# Patient Record
Sex: Male | Born: 1950 | Race: White | Hispanic: No | Marital: Married | Smoking: Current every day smoker
Health system: Southern US, Community
[De-identification: ages and names within clinical notes are randomized; demographics above are authoritative.]

## PROBLEM LIST (undated history)

## (undated) DIAGNOSIS — M1990 Unspecified osteoarthritis, unspecified site: Secondary | ICD-10-CM

## (undated) DIAGNOSIS — I10 Essential (primary) hypertension: Secondary | ICD-10-CM

## (undated) DIAGNOSIS — IMO0001 Reserved for inherently not codable concepts without codable children: Secondary | ICD-10-CM

## (undated) DIAGNOSIS — I499 Cardiac arrhythmia, unspecified: Secondary | ICD-10-CM

## (undated) DIAGNOSIS — Z7901 Long term (current) use of anticoagulants: Secondary | ICD-10-CM

## (undated) DIAGNOSIS — I4891 Unspecified atrial fibrillation: Secondary | ICD-10-CM

## (undated) DIAGNOSIS — E785 Hyperlipidemia, unspecified: Secondary | ICD-10-CM

## (undated) DIAGNOSIS — E669 Obesity, unspecified: Secondary | ICD-10-CM

## (undated) DIAGNOSIS — Z720 Tobacco use: Secondary | ICD-10-CM

## (undated) DIAGNOSIS — G4730 Sleep apnea, unspecified: Secondary | ICD-10-CM

## (undated) DIAGNOSIS — J449 Chronic obstructive pulmonary disease, unspecified: Secondary | ICD-10-CM

## (undated) HISTORY — DX: Hyperlipidemia, unspecified: E78.5

## (undated) HISTORY — DX: Obesity, unspecified: E66.9

## (undated) HISTORY — PX: JOINT REPLACEMENT: SHX530

## (undated) HISTORY — DX: Long term (current) use of anticoagulants: Z79.01

## (undated) HISTORY — DX: Sleep apnea, unspecified: G47.30

## (undated) HISTORY — DX: Essential (primary) hypertension: I10

## (undated) HISTORY — PX: SHOULDER SURGERY: SHX246

## (undated) HISTORY — DX: Unspecified atrial fibrillation: I48.91

## (undated) HISTORY — PX: TOTAL KNEE ARTHROPLASTY: SHX125

## (undated) HISTORY — DX: Tobacco use: Z72.0

## (undated) HISTORY — DX: Unspecified osteoarthritis, unspecified site: M19.90

---

## 2007-10-27 ENCOUNTER — Ambulatory Visit: Payer: Self-pay | Admitting: Cardiology

## 2007-11-02 ENCOUNTER — Ambulatory Visit: Payer: Self-pay | Admitting: Cardiology

## 2007-11-02 ENCOUNTER — Ambulatory Visit (HOSPITAL_COMMUNITY): Admission: RE | Admit: 2007-11-02 | Discharge: 2007-11-02 | Payer: Self-pay | Admitting: Cardiology

## 2007-11-02 ENCOUNTER — Encounter: Payer: Self-pay | Admitting: Cardiology

## 2007-11-10 ENCOUNTER — Ambulatory Visit: Payer: Self-pay | Admitting: Cardiology

## 2007-11-20 ENCOUNTER — Ambulatory Visit: Payer: Self-pay | Admitting: Cardiology

## 2007-11-27 ENCOUNTER — Ambulatory Visit: Payer: Self-pay | Admitting: Cardiology

## 2007-12-04 ENCOUNTER — Ambulatory Visit: Payer: Self-pay | Admitting: Cardiology

## 2007-12-11 ENCOUNTER — Ambulatory Visit: Payer: Self-pay | Admitting: Cardiology

## 2007-12-14 ENCOUNTER — Inpatient Hospital Stay (HOSPITAL_COMMUNITY): Admission: RE | Admit: 2007-12-14 | Discharge: 2007-12-17 | Payer: Self-pay | Admitting: Orthopedic Surgery

## 2008-01-10 ENCOUNTER — Encounter (HOSPITAL_COMMUNITY): Admission: RE | Admit: 2008-01-10 | Discharge: 2008-01-17 | Payer: Self-pay | Admitting: Orthopedic Surgery

## 2008-01-11 ENCOUNTER — Ambulatory Visit: Payer: Self-pay | Admitting: Cardiology

## 2008-01-18 ENCOUNTER — Ambulatory Visit: Payer: Self-pay | Admitting: Cardiology

## 2008-01-19 ENCOUNTER — Encounter (HOSPITAL_COMMUNITY): Admission: RE | Admit: 2008-01-19 | Discharge: 2008-02-18 | Payer: Self-pay | Admitting: Orthopedic Surgery

## 2008-01-24 ENCOUNTER — Ambulatory Visit: Payer: Self-pay | Admitting: Cardiology

## 2008-01-29 ENCOUNTER — Ambulatory Visit: Payer: Self-pay | Admitting: Cardiology

## 2008-02-16 ENCOUNTER — Ambulatory Visit: Payer: Self-pay | Admitting: Cardiology

## 2008-02-19 ENCOUNTER — Encounter (HOSPITAL_COMMUNITY): Admission: RE | Admit: 2008-02-19 | Discharge: 2008-03-01 | Payer: Self-pay | Admitting: Orthopedic Surgery

## 2008-03-04 ENCOUNTER — Ambulatory Visit: Payer: Self-pay | Admitting: Cardiology

## 2008-04-01 ENCOUNTER — Ambulatory Visit: Payer: Self-pay | Admitting: Cardiology

## 2008-04-08 ENCOUNTER — Ambulatory Visit: Payer: Self-pay | Admitting: Cardiology

## 2008-04-29 ENCOUNTER — Ambulatory Visit: Payer: Self-pay | Admitting: Cardiology

## 2008-05-27 ENCOUNTER — Ambulatory Visit: Payer: Self-pay | Admitting: Cardiology

## 2008-06-27 ENCOUNTER — Ambulatory Visit: Payer: Self-pay | Admitting: Cardiology

## 2008-07-04 ENCOUNTER — Ambulatory Visit: Payer: Self-pay | Admitting: Cardiology

## 2008-07-15 ENCOUNTER — Ambulatory Visit: Payer: Self-pay | Admitting: Cardiology

## 2008-08-12 ENCOUNTER — Ambulatory Visit: Payer: Self-pay | Admitting: Cardiology

## 2008-09-12 ENCOUNTER — Ambulatory Visit: Payer: Self-pay | Admitting: Cardiology

## 2008-09-19 ENCOUNTER — Inpatient Hospital Stay (HOSPITAL_COMMUNITY): Admission: RE | Admit: 2008-09-19 | Discharge: 2008-09-22 | Payer: Self-pay | Admitting: Orthopedic Surgery

## 2008-10-22 ENCOUNTER — Encounter (HOSPITAL_COMMUNITY): Admission: RE | Admit: 2008-10-22 | Discharge: 2008-11-21 | Payer: Self-pay | Admitting: Orthopedic Surgery

## 2008-10-24 ENCOUNTER — Ambulatory Visit: Payer: Self-pay | Admitting: Cardiology

## 2008-11-14 ENCOUNTER — Ambulatory Visit: Payer: Self-pay | Admitting: Cardiology

## 2008-11-25 ENCOUNTER — Encounter: Payer: Self-pay | Admitting: Cardiology

## 2008-11-25 ENCOUNTER — Encounter (HOSPITAL_COMMUNITY): Admission: RE | Admit: 2008-11-25 | Discharge: 2008-12-25 | Payer: Self-pay | Admitting: Orthopedic Surgery

## 2008-11-28 ENCOUNTER — Encounter: Payer: Self-pay | Admitting: Cardiology

## 2008-11-28 ENCOUNTER — Ambulatory Visit: Payer: Self-pay | Admitting: Cardiology

## 2008-12-02 ENCOUNTER — Encounter: Payer: Self-pay | Admitting: *Deleted

## 2008-12-26 ENCOUNTER — Encounter (HOSPITAL_COMMUNITY): Admission: RE | Admit: 2008-12-26 | Discharge: 2009-01-15 | Payer: Self-pay | Admitting: Orthopedic Surgery

## 2009-01-08 ENCOUNTER — Encounter (INDEPENDENT_AMBULATORY_CARE_PROVIDER_SITE_OTHER): Payer: Self-pay | Admitting: Cardiology

## 2009-01-20 ENCOUNTER — Ambulatory Visit: Payer: Self-pay | Admitting: Cardiology

## 2009-02-06 ENCOUNTER — Encounter (INDEPENDENT_AMBULATORY_CARE_PROVIDER_SITE_OTHER): Payer: Self-pay | Admitting: Cardiology

## 2009-02-17 ENCOUNTER — Ambulatory Visit: Payer: Self-pay | Admitting: Cardiology

## 2009-02-17 LAB — CONVERTED CEMR LAB: POC INR: 2.8

## 2009-03-18 ENCOUNTER — Ambulatory Visit: Payer: Self-pay | Admitting: Cardiology

## 2009-03-18 DIAGNOSIS — F17200 Nicotine dependence, unspecified, uncomplicated: Secondary | ICD-10-CM | POA: Insufficient documentation

## 2009-03-18 DIAGNOSIS — I4891 Unspecified atrial fibrillation: Secondary | ICD-10-CM | POA: Insufficient documentation

## 2009-03-18 LAB — CONVERTED CEMR LAB
Albumin: 4.1 g/dL (ref 3.5–5.2)
Basophils Relative: 0 % (ref 0–1)
CO2: 23 meq/L (ref 19–32)
Calcium: 9.3 mg/dL (ref 8.4–10.5)
Cholesterol: 147 mg/dL (ref 0–200)
Glucose, Bld: 112 mg/dL — ABNORMAL HIGH (ref 70–99)
Hemoglobin: 14.5 g/dL (ref 13.0–17.0)
MCHC: 32.8 g/dL (ref 30.0–36.0)
MCV: 85.5 fL (ref 78.0–100.0)
Monocytes Absolute: 0.8 10*3/uL (ref 0.1–1.0)
Monocytes Relative: 7 % (ref 3–12)
Neutro Abs: 9.3 10*3/uL — ABNORMAL HIGH (ref 1.7–7.7)
Potassium: 4.3 meq/L (ref 3.5–5.3)
RBC: 5.17 M/uL (ref 4.22–5.81)
Sodium: 144 meq/L (ref 135–145)
Total Bilirubin: 0.4 mg/dL (ref 0.3–1.2)
Total Protein: 6.6 g/dL (ref 6.0–8.3)
Triglycerides: 169 mg/dL — ABNORMAL HIGH (ref <150)

## 2009-03-19 ENCOUNTER — Encounter: Payer: Self-pay | Admitting: Cardiology

## 2009-03-31 ENCOUNTER — Telehealth (INDEPENDENT_AMBULATORY_CARE_PROVIDER_SITE_OTHER): Payer: Self-pay | Admitting: *Deleted

## 2009-04-17 ENCOUNTER — Encounter (INDEPENDENT_AMBULATORY_CARE_PROVIDER_SITE_OTHER): Payer: Self-pay | Admitting: *Deleted

## 2009-04-17 ENCOUNTER — Ambulatory Visit: Payer: Self-pay | Admitting: Cardiology

## 2009-04-17 LAB — CONVERTED CEMR LAB
OCCULT 2: NEGATIVE
OCCULT 3: NEGATIVE

## 2009-04-22 ENCOUNTER — Encounter (INDEPENDENT_AMBULATORY_CARE_PROVIDER_SITE_OTHER): Payer: Self-pay | Admitting: *Deleted

## 2009-05-12 ENCOUNTER — Encounter: Payer: Self-pay | Admitting: Cardiology

## 2009-05-12 LAB — CONVERTED CEMR LAB
Chloride: 103 meq/L (ref 96–112)
Potassium: 4.4 meq/L (ref 3.5–5.3)
Sodium: 140 meq/L (ref 135–145)

## 2009-05-13 ENCOUNTER — Encounter: Payer: Self-pay | Admitting: Cardiology

## 2009-05-15 ENCOUNTER — Ambulatory Visit: Payer: Self-pay | Admitting: Cardiovascular Disease

## 2009-06-02 ENCOUNTER — Telehealth (INDEPENDENT_AMBULATORY_CARE_PROVIDER_SITE_OTHER): Payer: Self-pay

## 2009-06-12 ENCOUNTER — Ambulatory Visit: Payer: Self-pay | Admitting: Cardiology

## 2009-06-12 LAB — CONVERTED CEMR LAB: POC INR: 1.5

## 2009-07-10 ENCOUNTER — Ambulatory Visit: Payer: Self-pay | Admitting: Cardiology

## 2009-07-10 LAB — CONVERTED CEMR LAB: POC INR: 2.4

## 2009-08-07 ENCOUNTER — Ambulatory Visit: Payer: Self-pay | Admitting: Cardiology

## 2009-09-04 ENCOUNTER — Encounter (INDEPENDENT_AMBULATORY_CARE_PROVIDER_SITE_OTHER): Payer: Self-pay | Admitting: *Deleted

## 2009-09-10 ENCOUNTER — Ambulatory Visit: Payer: Self-pay | Admitting: Cardiology

## 2009-09-10 LAB — CONVERTED CEMR LAB: POC INR: 2.5

## 2009-10-13 ENCOUNTER — Ambulatory Visit: Payer: Self-pay | Admitting: Cardiology

## 2009-10-13 LAB — CONVERTED CEMR LAB: POC INR: 1.9

## 2009-11-10 ENCOUNTER — Ambulatory Visit: Payer: Self-pay | Admitting: Cardiology

## 2009-12-08 ENCOUNTER — Ambulatory Visit: Payer: Self-pay | Admitting: Cardiology

## 2010-01-06 ENCOUNTER — Encounter (INDEPENDENT_AMBULATORY_CARE_PROVIDER_SITE_OTHER): Payer: Self-pay | Admitting: *Deleted

## 2010-01-14 ENCOUNTER — Ambulatory Visit: Payer: Self-pay | Admitting: Cardiology

## 2010-01-19 ENCOUNTER — Encounter: Payer: Self-pay | Admitting: Cardiology

## 2010-02-11 ENCOUNTER — Ambulatory Visit: Payer: Self-pay | Admitting: Cardiology

## 2010-03-11 ENCOUNTER — Ambulatory Visit: Payer: Self-pay | Admitting: Cardiology

## 2010-03-18 ENCOUNTER — Encounter (INDEPENDENT_AMBULATORY_CARE_PROVIDER_SITE_OTHER): Payer: Self-pay | Admitting: *Deleted

## 2010-04-08 ENCOUNTER — Encounter: Payer: Self-pay | Admitting: Cardiology

## 2010-04-08 ENCOUNTER — Ambulatory Visit: Payer: Self-pay | Admitting: Cardiology

## 2010-04-08 LAB — CONVERTED CEMR LAB: POC INR: 2.6

## 2010-05-07 ENCOUNTER — Ambulatory Visit
Admission: RE | Admit: 2010-05-07 | Discharge: 2010-05-07 | Payer: Self-pay | Source: Home / Self Care | Attending: Cardiology | Admitting: Cardiology

## 2010-05-07 ENCOUNTER — Ambulatory Visit: Admission: RE | Admit: 2010-05-07 | Discharge: 2010-05-07 | Payer: Self-pay | Source: Home / Self Care

## 2010-05-07 ENCOUNTER — Encounter: Payer: Self-pay | Admitting: Cardiology

## 2010-05-07 LAB — CONVERTED CEMR LAB
Albumin: 4.1 g/dL (ref 3.5–5.2)
CO2: 27 meq/L (ref 19–32)
Calcium: 9.7 mg/dL (ref 8.4–10.5)
Chloride: 106 meq/L (ref 96–112)
Eosinophils Relative: 2 % (ref 0–5)
Glucose, Bld: 114 mg/dL — ABNORMAL HIGH (ref 70–99)
HCT: 46.7 % (ref 39.0–52.0)
Hemoglobin: 15.6 g/dL (ref 13.0–17.0)
Lymphocytes Relative: 19 % (ref 12–46)
Lymphs Abs: 2.6 10*3/uL (ref 0.7–4.0)
Monocytes Absolute: 0.8 10*3/uL (ref 0.1–1.0)
Neutro Abs: 9.6 10*3/uL — ABNORMAL HIGH (ref 1.7–7.7)
POC INR: 2.3
Platelets: 252 10*3/uL (ref 150–400)
Sodium: 143 meq/L (ref 135–145)
Total Bilirubin: 0.5 mg/dL (ref 0.3–1.2)
Total Protein: 6.8 g/dL (ref 6.0–8.3)
WBC: 13.3 10*3/uL — ABNORMAL HIGH (ref 4.0–10.5)

## 2010-05-08 ENCOUNTER — Encounter: Payer: Self-pay | Admitting: Cardiology

## 2010-05-11 ENCOUNTER — Encounter (INDEPENDENT_AMBULATORY_CARE_PROVIDER_SITE_OTHER): Payer: Self-pay | Admitting: *Deleted

## 2010-05-21 NOTE — Letter (Signed)
Summary: Appointment - Missed  [facility]  [address], [place] [address]   Phone: [phone]  Fax: [phone]     Sep 04, 2009 MRN: [id number]   AZURE BARRALES [address] [place], [place]  [address]   Dear Mr. MCADORY,  Our records indicate you missed your appointment on      09/04/09                  [facility]            It is very important that we reach you to reschedule this appointment. We look forward to participating in your health care needs. Please contact us at the number listed above at your earliest convenience to reschedule this appointment.     Sincerely,    Glass blower/designer

## 2010-05-21 NOTE — Miscellaneous (Signed)
  Clinical Lists Changes  Medications: Removed medication of LISINOPRIL-HYDROCHLOROTHIAZIDE 20-12.5 MG TABS (LISINOPRIL-HYDROCHLOROTHIAZIDE) take 1 tablet by mouth once daily

## 2010-05-21 NOTE — Miscellaneous (Signed)
Summary: stool cards 04/17/2009  Clinical Lists Changes  Observations: Added new observation of HEMOCCULT 3: neg (04/17/2009 9:23) Added new observation of HEMOCCULT 2: neg (04/17/2009 9:23) Added new observation of HEMOCCULT 1: neg (04/17/2009 9:23)

## 2010-05-21 NOTE — Letter (Signed)
Summary: Appointment - Missed  [facility]     [place], [place]    Phone:   Fax:      January 06, 2010 MRN: [id number]   IZACC DEMEYER [address] [place], [place]  [address]   Dear Mr. CONLEY,  Our records indicate you missed your appointment on          01/05/10 [facility]          It is very important that we reach you to reschedule this appointment. We look forward to participating in your health care needs. Please contact us at the number listed above at your earliest convenience to reschedule this appointment.     Sincerely,    Glass blower/designer

## 2010-05-21 NOTE — Medication Information (Signed)
Summary: ccr-lr  Anticoagulant Therapy  Managed by: [name], RN PCP: Karleen Hampshire Supervising MD: Dietrich Pates MD, Molly Maduro Indication 1: Atrial Fibrillation Lab Used: [facility] [place] Site: [place] INR POC 1.9  Dietary changes: no    Health status changes: no    Bleeding/hemorrhagic complications: no    Recent/future hospitalizations: no    Any changes in medication regimen? no    Recent/future dental: no  Any missed doses?: no       Is patient compliant with meds? yes       Allergies: 1)  ! * Iv Dye  Anticoagulation Management History:      The patient is taking warfarin and comes in today for a routine follow up visit.  Negative risk factors for bleeding include an age less than 36 years old.  The bleeding index is 'low risk'.  Positive CHADS2 values include History of HTN.  Negative CHADS2 values include Age > 9 years old.  The start date was 10/27/2007.  Anticoagulation responsible provider: Dietrich Pates MD, Molly Maduro.  INR POC: 1.9.  Cuvette Lot#: [id number].  Exp: 02/12.    Anticoagulation Management Assessment/Plan:      The patient's current anticoagulation dose is Warfarin sodium 10 mg tabs: Take 1 tablet by mouth as directed.  The target INR is 2.0-3.0.  The next INR is due 12/08/2009.  Anticoagulation instructions were given to patient.  Results were reviewed/authorized by [name], RN.  He was notified by [name] RN.         Prior Anticoagulation Instructions: INR 1.9 Take coumadin 1 1/2 tablet tonight then resume 1 tablet once daily   Current Anticoagulation Instructions: INR 1.9 Increase coumadin to 10mg  once daily except 15mg  on Mondays

## 2010-05-21 NOTE — Medication Information (Signed)
Summary: ccr-lr  Anticoagulant Therapy  Managed by: [name], RN PCP: Karleen Hampshire Supervising MD: Dietrich Pates MD, Molly Maduro Indication 1: Atrial Fibrillation (ICD-427.31) Lab Used: [facility] [place] Site: [place] INR POC 1.5  Dietary changes: no    Health status changes: no    Bleeding/hemorrhagic complications: no    Recent/future hospitalizations: no    Any changes in medication regimen? no    Recent/future dental: no  Any missed doses?: yes     Details: missed 1 dose this week  Is patient compliant with meds? yes       Allergies: 1)  ! * Iv Dye  Anticoagulation Management History:      The patient is taking warfarin and comes in today for a routine follow up visit.  Negative risk factors for bleeding include an age less than 61 years old.  The bleeding index is 'low risk'.  Positive CHADS2 values include History of HTN.  Negative CHADS2 values include Age > 61 years old.  The start date was 10/27/2007.  Anticoagulation responsible provider: Dietrich Pates MD, Molly Maduro.  INR POC: 1.5.  Cuvette Lot#: [id number].  Exp: 02/12.    Anticoagulation Management Assessment/Plan:      The patient's current anticoagulation dose is Warfarin sodium 10 mg tabs: Take 1 tablet by mouth as directed.  The target INR is 2 - 3.  The next INR is due 07/10/2009.  Anticoagulation instructions were given to patient.  Results were reviewed/authorized by [name], RN.  He was notified by [name] RN.         Prior Anticoagulation Instructions: INR 2.2 Continue coumadin 10mg  once daily   Current Anticoagulation Instructions: INR 1.5 Take coumadin 1 1/2 tablets tonight and tomorrow night then resume 1 tablet once daily

## 2010-05-21 NOTE — Medication Information (Signed)
Summary: ccr-lr  Anticoagulant Therapy  Managed by: [name], RN PCP: Karleen Hampshire Supervising MD: Diona Browner MD, Remi Deter Indication 1: Atrial Fibrillation Lab Used: [facility] [place] Site: [place] INR POC 2.1  Dietary changes: no    Health status changes: no    Bleeding/hemorrhagic complications: no    Recent/future hospitalizations: no    Any changes in medication regimen? no    Recent/future dental: no  Any missed doses?: no       Is patient compliant with meds? yes       Allergies: 1)  ! * Iv Dye  Anticoagulation Management History:      The patient is taking warfarin and comes in today for a routine follow up visit.  Negative risk factors for bleeding include an age less than 56 years old.  The bleeding index is 'low risk'.  Positive CHADS2 values include History of HTN.  Negative CHADS2 values include Age > 71 years old.  The start date was 10/27/2007.  Anticoagulation responsible Mekaela Azizi: Diona Browner MD, Remi Deter.  INR POC: 2.1.  Cuvette Lot#: [id number].  Exp: 02/12.    Anticoagulation Management Assessment/Plan:      The patient's current anticoagulation dose is Warfarin sodium 10 mg tabs: Take 1 tablet by mouth as directed.  The target INR is 2.0-3.0.  The next INR is due 04/08/2010.  Anticoagulation instructions were given to patient.  Results were reviewed/authorized by [name], RN.  He was notified by [name] RN.         Prior Anticoagulation Instructions: INR 2.2 Continue coumadin 10mg  once daily except 15mg  on Mondays  Current Anticoagulation Instructions: INR 2.1 Continue coumadin 10mg  once daily except 15mg  on Mondays

## 2010-05-21 NOTE — Letter (Signed)
Summary: DILTIAZEM APPROVAL  DILTIAZEM APPROVAL   Imported By: Faythe Ghee 01/19/2010 09:58:30  _____________________________________________________________________  External Attachment:    Type:   Image     Comment:   External Document

## 2010-05-21 NOTE — Medication Information (Signed)
Summary: ccr-lr  Anticoagulant Therapy  Managed by: [name], RN PCP: Karleen Hampshire Supervising MD: Daleen Squibb MD, Maisie Fus Indication 1: Atrial Fibrillation Lab Used: [facility] [place] Site: [place] INR POC 1.9  Dietary changes: no    Health status changes: no    Bleeding/hemorrhagic complications: no    Recent/future hospitalizations: no    Any changes in medication regimen? no    Recent/future dental: no  Any missed doses?: yes     Details: missed 1 dose last Thursday  Is patient compliant with meds? yes       Allergies: 1)  ! * Iv Dye  Anticoagulation Management History:      The patient is taking warfarin and comes in today for a routine follow up visit.  Negative risk factors for bleeding include an age less than 4 years old.  The bleeding index is 'low risk'.  Positive CHADS2 values include History of HTN.  Negative CHADS2 values include Age > 5 years old.  The start date was 10/27/2007.  Anticoagulation responsible provider: Daleen Squibb MD, Maisie Fus.  INR POC: 1.9.  Cuvette Lot#: [id number].  Exp: 02/12.    Anticoagulation Management Assessment/Plan:      The patient's current anticoagulation dose is Warfarin sodium 10 mg tabs: Take 1 tablet by mouth as directed.  The target INR is 2.0-3.0.  The next INR is due 01/05/2010.  Anticoagulation instructions were given to patient.  Results were reviewed/authorized by [name], RN.  He was notified by [name] RN.         Prior Anticoagulation Instructions: INR 1.9 Increase coumadin to 10mg  once daily except 15mg  on Mondays  Current Anticoagulation Instructions: INR 1.9 Take coumadin 2 tablets tonight then resume 1 tablet once daily except 1 1/2 on Mondays

## 2010-05-21 NOTE — Medication Information (Signed)
Summary: ccr  Anticoagulant Therapy  Managed by: [name], RN PCP: Karleen Hampshire Supervising MD: Diona Browner MD, Remi Deter Indication 1: Atrial Fibrillation Lab Used: [facility] [place] Site: [place] INR POC 2.5  Dietary changes: no    Health status changes: no    Bleeding/hemorrhagic complications: no    Recent/future hospitalizations: no    Any changes in medication regimen? no    Recent/future dental: no  Any missed doses?: yes     Details: missed 1 dose  Is patient compliant with meds? yes       Allergies: 1)  ! * Iv Dye  Anticoagulation Management History:      The patient is taking warfarin and comes in today for a routine follow up visit.  Negative risk factors for bleeding include an age less than 36 years old.  The bleeding index is 'low risk'.  Positive CHADS2 values include History of HTN.  Negative CHADS2 values include Age > 69 years old.  The start date was 10/27/2007.  Anticoagulation responsible provider: Diona Browner MD, Remi Deter.  INR POC: 2.5.  Cuvette Lot#: [id number].  Exp: 02/12.    Anticoagulation Management Assessment/Plan:      The patient's current anticoagulation dose is Warfarin sodium 10 mg tabs: Take 1 tablet by mouth as directed.  The target INR is 2.0-3.0.  The next INR is due 10/13/2009.  Anticoagulation instructions were given to patient.  Results were reviewed/authorized by [name], RN.  He was notified by [name] RN.         Prior Anticoagulation Instructions: INR 2.6 Continue coumadin 10mg  once daily   Current Anticoagulation Instructions: INR 2.5 Continue coumadin 10mg  once daily

## 2010-05-21 NOTE — Medication Information (Signed)
Summary: ccr-lr  Anticoagulant Therapy  Managed by: [name], RN PCP: Karleen Hampshire Supervising MD: Eden Emms MD, Theron Arista Indication 1: Atrial Fibrillation (ICD-427.31) Lab Used: [facility] [place] Site: [place] INR POC 2.2  Dietary changes: no    Health status changes: no    Bleeding/hemorrhagic complications: no    Recent/future hospitalizations: no    Any changes in medication regimen? no    Recent/future dental: no  Any missed doses?: no       Is patient compliant with meds? yes       Allergies: 1)  ! * Iv Dye  Anticoagulation Management History:      The patient is taking warfarin and comes in today for a routine follow up visit.  Negative risk factors for bleeding include an age less than 39 years old.  The bleeding index is 'low risk'.  Positive CHADS2 values include History of HTN.  Negative CHADS2 values include Age > 73 years old.  The start date was 10/27/2007.  Anticoagulation responsible provider: Eden Emms MD, Theron Arista.  INR POC: 2.2.  Cuvette Lot#: [id number].  Exp: 02/12.    Anticoagulation Management Assessment/Plan:      The patient's current anticoagulation dose is Warfarin sodium 10 mg tabs: Take 1 tablet by mouth as directed.  The target INR is 2 - 3.  The next INR is due 06/12/2009.  Anticoagulation instructions were given to patient.  Results were reviewed/authorized by [name], RN.  He was notified by [name] RN.         Prior Anticoagulation Instructions: INR 2.3 Continue coumadin 10mg  once daily   Current Anticoagulation Instructions: INR 2.2 Continue coumadin 10mg  once daily

## 2010-05-21 NOTE — Letter (Signed)
Summary: [place] [facility], agricultural at [facility]  [address], [place] [address]   Phone: [phone]  Fax: [phone]      May 11, 2010 MRN: [id number]   SHAW DOBEK [address] [place], [place]  [address]   Dear Mr. BAZEN,  Your test ordered by Selena Batten has been reviewed by your physician (or physician assistant) and was found to be normal or stable. Your physician (or physician assistant) felt no changes were needed at this time.  ____ Echocardiogram  ____ Cardiac Stress Test  __x__ Lab Work  ____ Peripheral vascular study of arms, legs or neck  ____ CT scan or X-ray  ____ Lung or Breathing test  ____ Other:  No change in medical treatment at this time, per Dr. Dietrich Pates. Enclosed is a copy of your labwork for your records.  Thank you, Danil Wedge Allyne Gee RN    [place] Bing, MD, Lenise Arena.C.Gaylord Shih, MD, F.A.C.C Lewayne Bunting, MD, F.A.C.C Nona Dell, MD, F.A.C.C Charlton Haws, MD, Lenise Arena.C.C

## 2010-05-21 NOTE — Medication Information (Signed)
Summary: PROTIME/TG  Anticoagulant Therapy  Managed by: [name], RN PCP: Karleen Hampshire Supervising MD: Dietrich Pates MD, Molly Maduro Indication 1: Atrial Fibrillation Lab Used: [facility] [place] Site: [place] INR POC 2.0  Dietary changes: no    Health status changes: no    Bleeding/hemorrhagic complications: no    Recent/future hospitalizations: no    Any changes in medication regimen? no    Recent/future dental: no  Any missed doses?: yes       Is patient compliant with meds? yes       Allergies: 1)  ! * Iv Dye  Anticoagulation Management History:      The patient is taking warfarin and comes in today for a routine follow up visit.  Negative risk factors for bleeding include an age less than 42 years old.  The bleeding index is 'low risk'.  Positive CHADS2 values include History of HTN.  Negative CHADS2 values include Age > 62 years old.  The start date was 10/27/2007.  Anticoagulation responsible provider: Dietrich Pates MD, Molly Maduro.  INR POC: 2.0.  Cuvette Lot#: [id number].  Exp: 02/12.    Anticoagulation Management Assessment/Plan:      The patient's current anticoagulation dose is Warfarin sodium 10 mg tabs: Take 1 tablet by mouth as directed.  The target INR is 2.0-3.0.  The next INR is due 02/11/2010.  Anticoagulation instructions were given to patient.  Results were reviewed/authorized by [name], RN.  He was notified by [name] RN.         Prior Anticoagulation Instructions: INR 1.9 Take coumadin 2 tablets tonight then resume 1 tablet once daily except 1 1/2 on Mondays  Current Anticoagulation Instructions: INR 2.0 Take 1 1/2 tablets tonight then resume 1 tablet once daily except 1 1/2 tablets on Mondays

## 2010-05-21 NOTE — Medication Information (Signed)
Summary: ccr-lr  Anticoagulant Therapy  Managed by: [name], RN PCP: Karleen Hampshire Supervising MD: Dietrich Pates MD, Molly Maduro Indication 1: Atrial Fibrillation Lab Used: [facility] [place] Site: [place] INR POC 2.3  Dietary changes: no    Health status changes: no    Bleeding/hemorrhagic complications: no    Recent/future hospitalizations: no    Any changes in medication regimen? no    Recent/future dental: no  Any missed doses?: yes     Details: missed 1 dose 3-4 wk ago  Is patient compliant with meds? yes       Allergies: 1)  ! * Iv Dye  Anticoagulation Management History:      The patient is taking warfarin and comes in today for a routine follow up visit.  Negative risk factors for bleeding include an age less than 79 years old.  The bleeding index is 'low risk'.  Positive CHADS2 values include History of HTN.  Negative CHADS2 values include Age > 19 years old.  The start date was 10/27/2007.  Anticoagulation responsible provider: Dietrich Pates MD, Molly Maduro.  INR POC: 2.3.  Cuvette Lot#: [id number].  Exp: 02/12.    Anticoagulation Management Assessment/Plan:      The patient's current anticoagulation dose is Warfarin sodium 10 mg tabs: Take 1 tablet by mouth as directed.  The target INR is 2.0-3.0.  The next INR is due 06/04/2010.  Anticoagulation instructions were given to patient.  Results were reviewed/authorized by [name], RN.  He was notified by [name] RN.         Prior Anticoagulation Instructions: INR 2.6 Continue coumadin 10mg  once daily except 15mg  on Mondays  Current Anticoagulation Instructions: INR 2.3 Continue coumadin 10mg  once daily except 15mg  on Mondays

## 2010-05-21 NOTE — Medication Information (Signed)
Summary: ccr-lr  Anticoagulant Therapy  Managed by: [name], RN PCP: Karleen Hampshire Supervising MD: Diona Browner MD, Remi Deter Indication 1: Atrial Fibrillation (ICD-427.31) Lab Used: [facility] [place] Site: [place] INR POC 2.4  Dietary changes: no    Health status changes: no    Bleeding/hemorrhagic complications: no    Recent/future hospitalizations: no    Any changes in medication regimen? no    Recent/future dental: no  Any missed doses?: no       Is patient compliant with meds? yes       Allergies: 1)  ! * Iv Dye  Anticoagulation Management History:      The patient is taking warfarin and comes in today for a routine follow up visit.  Negative risk factors for bleeding include an age less than 63 years old.  The bleeding index is 'low risk'.  Positive CHADS2 values include History of HTN.  Negative CHADS2 values include Age > 70 years old.  The start date was 10/27/2007.  Anticoagulation responsible provider: Diona Browner MD, Remi Deter.  INR POC: 2.4.  Cuvette Lot#: [id number].  Exp: 02/12.    Anticoagulation Management Assessment/Plan:      The patient's current anticoagulation dose is Warfarin sodium 10 mg tabs: Take 1 tablet by mouth as directed.  The target INR is 2 - 3.  The next INR is due 08/07/2009.  Anticoagulation instructions were given to patient.  Results were reviewed/authorized by [name], RN.  He was notified by [name] RN.         Prior Anticoagulation Instructions: INR 1.5 Take coumadin 1 1/2 tablets tonight and tomorrow night then resume 1 tablet once daily   Current Anticoagulation Instructions: INR 2.4 Continue coumadin 10mg  once daily

## 2010-05-21 NOTE — Progress Notes (Signed)
Summary: blood presure med  Phone Note Call from Patient Call back at [phone]   Caller: Patient Reason for Call: Talk to Nurse Summary of Call: Request Dr. Dietrich Pates to fill blook presure med., Diltiazem.  Dr. Dietrich Pates has never filled the med. Patient wanted to know if Dr. Dietrich Pates wanted to start filling his blood presure med since Dr is seeing patient now.  Patient uses [facility] in [place]. Initial call taken by: Alexis Goodell,  June 02, 2009 9:43 AM  Follow-up for Phone Call        Patient advised that RX was faxed to [facility]. Follow-up by: Larita Fife Via LPN,  June 02, 2009 11:38 AM    New/Updated Medications: CARDIZEM CD 240 MG XR24H-CAP (DILTIAZEM HCL COATED BEADS) take 1 tab two times a day Prescriptions: CARDIZEM CD 240 MG XR24H-CAP (DILTIAZEM HCL COATED BEADS) take 1 tab two times a day  #60 x 8   Entered by:   Larita Fife Via LPN   Authorized by:   Kathlen Brunswick, MD, [facility]   Signed by:   Larita Fife Via LPN on 78/29/5621   Method used:   Electronically to        [facility] (retail)       [address]       [place], [place]  [address]       Ph: [phone]       Fax: [phone]   RxID:   [id number]

## 2010-05-21 NOTE — Assessment & Plan Note (Signed)
Summary: past due /tg   Visit Type:  Follow-up Primary Provider:  Karleen Hampshire   History of Present Illness:  [name] is seen as scheduled for continued assessment and treatment of chronic atrial fibrillation, hypertension and hyperlipidemia.  Since last visit, he has done generally well.  A right knee injury followed left TKA, but he is now recovering well.  Unfortunately, he has right shoulder problems for which future orthopaedic surgery has been suggested.  He experiences pain at work in the afternoon, but does not take oxycodone until he returns home.  He has not required urgent medical care or developed any other new medical problems.  He was last seen by his primary care physician 6 months ago.  EKG  Procedure date:  05/07/2010  Findings:      Rhythm Strip  Atrial fibrillation with a controlled ventricular response Heart rate of 85 bpm   Current Medications (verified): 1)  Lisinopril-Hydrochlorothiazide 20-12.5 Mg Tabs (Lisinopril-Hydrochlorothiazide) .... 2 Tab By Mouth Once Daily 2)  Simvastatin 40 Mg Tabs (Simvastatin) .... Take 1 Tablet Daily 3)  Warfarin Sodium 10 Mg Tabs (Warfarin Sodium) .... Take 1 Tablet By Mouth As Directed 4)  Cardizem Cd 240 Mg Xr24h-Cap (Diltiazem Hcl Coated Beads) .... Take 1 Tab Two Times A Day 5)  Tylenol Extra Strength 500 Mg Tabs (Acetaminophen) .... Take As Needed For Pain 6)  Oxycodone-Acetaminophen 5-325 Mg Tabs (Oxycodone-Acetaminophen) .... As Needed For Pain 7)  Aleve 220 Mg Tabs (Naproxen Sodium) .[name].. 1 or 2 Tablets Once Daily As Needed  Allergies (verified): 1)  ! * Iv Dye  Comments:  Nurse/Medical Assistant: patient brought meds he uses [facility] in [place] for meds  Past History:  PMH, FH, and Social History reviewed and updated.  Past Medical History: Atrial fibrillation; anticoagulation; unknown onset HYPERLIPIDEMIA Hypertension Tobacco abuse DEGENERATIVE JOINT DISEASE, KNEE-status post left TKA; right  shoulder will require replacement Obesity  Review of Systems       The patient complains of weight gain.  The patient denies vision loss, decreased hearing, hoarseness, chest pain, syncope, dyspnea on exertion, peripheral edema, prolonged cough, hemoptysis, and abdominal pain.    Vital Signs:  Patient profile:   60 year old male Weight:      384 pounds BMI:     56.91 Pulse rate:   77 / minute BP sitting:   141 / 90  (left arm)  Vitals Entered By: Dreama Saa, CNA (May 07, 2010 1:25 PM)  Physical Exam  General:  Obese; well developed; no acute distress Neck-No JVD; no carotid bruits: Lungs-No tachypnea, no rales; no rhonchi; no wheezes; decreased breath sounds at the bases Cardiovascular-normal PMI; distant S1 and S2; irregular rhythm Abdomen-BS normal; soft and non-tender without masses or organomegaly:  Musculoskeletal-No deformities, no cyanosis or clubbing: Neurologic-Normal cranial nerves; symmetric strength and tone:  Skin-Warm,no significant lesions Extremities-Nl distal pulses; no edema     Impression & Recommendations:  Problem # 1:  HYPERTENSION (ICD-401.1) Control of hypertension is adequate and would likely be even better had Tony [name] not gained 40 pounds since his last visit.  He reports restricting calories and losing 5-6 pounds since the holidays and is encouraged to continue on this track and to check blood pressure periodically.  He will call for systolics above 150 or diastolics above 90.  Problem # 2:  ATRIAL FIBRILLATION (ICD-427.31) Heart rate is well controlled, and patient is asymptomatic with respect to his arrhythmia.  Problem # 3:  ANTICOAGULATION (ICD-V58.61) INRs have  been stable and therapeutic.  A CBC and stool for Hemoccult testing will be obtained to rule out occult GI blood loss.  Problem # 4:  HYPERLIPIDEMIA (ICD-V13.8) Lipid profile was good a year ago.  In the absence of known vascular disease, lipids he not be assessed for another  year.  Other Orders: T-Comprehensive Metabolic Panel [phone]) T-CBC w/Diff [phone]) Hemoccult Cards (Take Home) (Hemoccult Cards)  Patient Instructions: 1)  Your physician recommends that you schedule a follow-up appointment in: 1 year 2)  Your physician recommends that you return for lab work in: today 3)  Your physician has recommended you make the following change in your medication: Start taking Aleve (over the counter) 1 to 2 tablets once daily as needed for pain  4)  Your physician discussed the importance of regular exercise and recommended that you start or continue a regular exercise program for good health. 5)  Your physician has asked that you test your stool for blood. It is necessary to test 3 different stool specimens for accuracy. You will be given 3 hemoccult cards for specimen collection. For each stool specimen, place a small portion of stool sample (from 2 different areas of the stool) into the 2 squares on the card. Close card. Repeat with 2 more stool specimens. Bring the cards back to the office for testing. Prescriptions: CARDIZEM CD 240 MG XR24H-CAP (DILTIAZEM HCL COATED BEADS) take 1 tab two times a day  #60 Capsule x 11   Entered by:   Larita Fife Via LPN   Authorized by:   Kathlen Brunswick, MD, [facility]   Signed by:   Larita Fife Via LPN on 29/56/2130   Method used:   Electronically to        [facility] [address] (retail)       [address]       [place], [place]  [address]       Ph: [phone]       Fax: [phone]   RxID:   [id number] WARFARIN SODIUM 10 MG TABS (WARFARIN SODIUM) Take 1 tablet by mouth as directed  #60 Tablet x 1   Entered by:   Larita Fife Via LPN   Authorized by:   Kathlen Brunswick, MD, [facility]   Signed by:   Larita Fife Via LPN on 63/87/5643   Method used:   Electronically to        [facility] [address] (retail)       [address]       [place], [place]  [address]       Ph: [phone]       Fax:  [phone]   RxID:   [id number] SIMVASTATIN 40 MG TABS (SIMVASTATIN) take 1 tablet daily  #30 x 11   Entered by:   Larita Fife Via LPN   Authorized by:   Kathlen Brunswick, MD, [facility]   Signed by:   Larita Fife Via LPN on 62/83/1517   Method used:   Electronically to        [facility] [address] (retail)       [address]       [place], [place]  [address]       Ph: [phone]       Fax: [phone]   RxID:   [id number] LISINOPRIL-HYDROCHLOROTHIAZIDE 20-12.5 MG TABS (LISINOPRIL-HYDROCHLOROTHIAZIDE) 2 tab by mouth once daily  #  60 x 11   Entered by:   Larita Fife Via LPN   Authorized by:   Kathlen Brunswick, MD, [facility]   Signed by:   Larita Fife Via LPN on 04/54/0981   Method used:   Electronically to        [facility] [address] (retail)       [address]       [place], [place]  [address]       Ph: [phone]       Fax: [phone]   RxID:   [id number]

## 2010-05-21 NOTE — Medication Information (Signed)
Summary: ccr-lr  Anticoagulant Therapy  Managed by: [name], RN PCP: Karleen Hampshire Supervising MD: Dietrich Pates MD, Molly Maduro Indication 1: Atrial Fibrillation Lab Used: [facility] [place] Site: [place] INR POC 1.9  Dietary changes: no    Health status changes: no    Bleeding/hemorrhagic complications: no    Recent/future hospitalizations: no    Any changes in medication regimen? no    Recent/future dental: no  Any missed doses?: yes     Details: missed 1 dose  Is patient compliant with meds? yes       Allergies: 1)  ! * Iv Dye  Anticoagulation Management History:      The patient is taking warfarin and comes in today for a routine follow up visit.  Negative risk factors for bleeding include an age less than 70 years old.  The bleeding index is 'low risk'.  Positive CHADS2 values include History of HTN.  Negative CHADS2 values include Age > 39 years old.  The start date was 10/27/2007.  Anticoagulation responsible provider: Dietrich Pates MD, Molly Maduro.  INR POC: 1.9.  Cuvette Lot#: [id number].  Exp: 02/12.    Anticoagulation Management Assessment/Plan:      The patient's current anticoagulation dose is Warfarin sodium 10 mg tabs: Take 1 tablet by mouth as directed.  The target INR is 2.0-3.0.  The next INR is due 11/10/2009.  Anticoagulation instructions were given to patient.  Results were reviewed/authorized by [name], RN.  He was notified by [name] RN.         Prior Anticoagulation Instructions: INR 2.5 Continue coumadin 10mg  once daily   Current Anticoagulation Instructions: INR 1.9 Take coumadin 1 1/2 tablet tonight then resume 1 tablet once daily

## 2010-05-21 NOTE — Medication Information (Signed)
Summary: ccr-lr  Anticoagulant Therapy  Managed by: [name], RN PCP: Karleen Hampshire Supervising MD: Diona Browner MD, Remi Deter Indication 1: Atrial Fibrillation Lab Used: [facility] [place] Site:  INR POC 2.2  Dietary changes: no    Health status changes: no    Bleeding/hemorrhagic complications: no    Recent/future hospitalizations: no    Any changes in medication regimen? no    Recent/future dental: no  Any missed doses?: no       Is patient compliant with meds? yes       Allergies: 1)  ! * Iv Dye  Anticoagulation Management History:      The patient is taking warfarin and comes in today for a routine follow up visit.  Negative risk factors for bleeding include an age less than 5 years old.  The bleeding index is 'low risk'.  Positive CHADS2 values include History of HTN.  Negative CHADS2 values include Age > 88 years old.  The start date was 10/27/2007.  Anticoagulation responsible provider: Diona Browner MD, Remi Deter.  INR POC: 2.2.  Cuvette Lot#: [id number].  Exp: 02/12.    Anticoagulation Management Assessment/Plan:      The patient's current anticoagulation dose is Warfarin sodium 10 mg tabs: Take 1 tablet by mouth as directed.  The target INR is 2.0-3.0.  The next INR is due 03/11/2010.  Anticoagulation instructions were given to patient.  Results were reviewed/authorized by [name], RN.  He was notified by [name] RN.         Prior Anticoagulation Instructions: INR 2.0 Take 1 1/2 tablets tonight then resume 1 tablet once daily except 1 1/2 tablets on Mondays  Current Anticoagulation Instructions: INR 2.2 Continue coumadin 10mg  once daily except 15mg  on Mondays

## 2010-05-21 NOTE — Letter (Signed)
Summary: [place] [facility], [facility] at [facility]  [address], [place] [address]   Phone: [phone]  Fax: [phone]      May 13, 2009 MRN: [id number]   OVID WITMAN [address] [place], [place]  [address]   Dear Mr. HANSSON,  Your test ordered by Selena Batten has been reviewed by your physician (or physician assistant) and was found to be normal or stable. Your physician (or physician assistant) felt no changes were needed at this time.  ____ Echocardiogram  ____ Cardiac Stress Test  __X__ Lab Work  ____ Peripheral vascular study of arms, legs or neck  ____ CT scan or X-ray  ____ Lung or Breathing test  ____ Other: Please continue on current medical treatment.   Thank you.   [place], MD, F.A.C.C

## 2010-05-21 NOTE — Medication Information (Signed)
Summary: [facility]  Anticoagulant Therapy  Managed by: [name], RN PCP: Karleen Hampshire Supervising MD: Dietrich Pates MD, Molly Maduro Indication 1: Atrial Fibrillation Lab Used: [facility] [place] Site: [place] INR POC 2.6  Dietary changes: no    Health status changes: no    Bleeding/hemorrhagic complications: no    Recent/future hospitalizations: no    Any changes in medication regimen? no    Recent/future dental: no  Any missed doses?: yes     Details: Missed 1 dose weeks ago  Is patient compliant with meds? yes       Allergies: 1)  ! * Iv Dye  Anticoagulation Management History:      The patient is taking warfarin and comes in today for a routine follow up visit.  Negative risk factors for bleeding include an age less than 65 years old.  The bleeding index is 'low risk'.  Positive CHADS2 values include History of HTN.  Negative CHADS2 values include Age > 41 years old.  The start date was 10/27/2007.  Anticoagulation responsible provider: Dietrich Pates MD, Molly Maduro.  INR POC: 2.6.  Cuvette Lot#: [id number].  Exp: 02/12.    Anticoagulation Management Assessment/Plan:      The patient's current anticoagulation dose is Warfarin sodium 10 mg tabs: Take 1 tablet by mouth as directed.  The target INR is 2.0-3.0.  The next INR is due 05/06/2010.  Anticoagulation instructions were given to patient.  Results were reviewed/authorized by [name], RN.  He was notified by [name] RN.         Prior Anticoagulation Instructions: INR 2.1 Continue coumadin 10mg  once daily except 15mg  on Mondays  Current Anticoagulation Instructions: INR 2.6 Continue coumadin 10mg  once daily except 15mg  on Mondays

## 2010-05-21 NOTE — Medication Information (Signed)
Summary: ccr-lr  Anticoagulant Therapy  Managed by: [name], RN PCP: Karleen Hampshire Supervising MD: Diona Browner MD, Remi Deter Indication 1: Atrial Fibrillation (ICD-427.31) Lab Used: [facility] [place] Site: [place] INR POC 2.6  Dietary changes: no    Health status changes: no    Bleeding/hemorrhagic complications: no    Recent/future hospitalizations: no    Any changes in medication regimen? no    Recent/future dental: no  Any missed doses?: no       Is patient compliant with meds? yes       Allergies: 1)  ! * Iv Dye  Anticoagulation Management History:      The patient is taking warfarin and comes in today for a routine follow up visit.  Negative risk factors for bleeding include an age less than 61 years old.  The bleeding index is 'low risk'.  Positive CHADS2 values include History of HTN.  Negative CHADS2 values include Age > 2 years old.  The start date was 10/27/2007.  Anticoagulation responsible provider: Diona Browner MD, Remi Deter.  INR POC: 2.6.  Cuvette Lot#: [id number].  Exp: 02/12.    Anticoagulation Management Assessment/Plan:      The patient's current anticoagulation dose is Warfarin sodium 10 mg tabs: Take 1 tablet by mouth as directed.  The target INR is 2 - 3.  The next INR is due 09/04/2009.  Anticoagulation instructions were given to patient.  Results were reviewed/authorized by [name], RN.  He was notified by [name] RN.         Prior Anticoagulation Instructions: INR 2.4 Continue coumadin 10mg  once daily   Current Anticoagulation Instructions: INR 2.6 Continue coumadin 10mg  once daily

## 2010-06-04 ENCOUNTER — Encounter (INDEPENDENT_AMBULATORY_CARE_PROVIDER_SITE_OTHER): Payer: BC Managed Care – PPO

## 2010-06-04 ENCOUNTER — Encounter: Payer: Self-pay | Admitting: Cardiology

## 2010-06-04 DIAGNOSIS — I4891 Unspecified atrial fibrillation: Secondary | ICD-10-CM

## 2010-06-04 DIAGNOSIS — Z7901 Long term (current) use of anticoagulants: Secondary | ICD-10-CM

## 2010-06-04 LAB — CONVERTED CEMR LAB: POC INR: 1.8

## 2010-06-10 NOTE — Medication Information (Signed)
Summary: ccr/rm  Anticoagulant Therapy  Managed by: [name], RN PCP: Karleen Hampshire Supervising MD: Diona Browner MD, Remi Deter Indication 1: Atrial Fibrillation Lab Used: [facility] [place] Site: [place] INR POC 1.8  Dietary changes: no    Health status changes: no    Bleeding/hemorrhagic complications: no    Recent/future hospitalizations: no    Any changes in medication regimen? no    Recent/future dental: no  Any missed doses?: yes     Details: missed 1 dose  3 wks ago  Is patient compliant with meds? yes       Allergies: 1)  ! * Iv Dye  Anticoagulation Management History:      The patient is taking warfarin and comes in today for a routine follow up visit.  Negative risk factors for bleeding include an age less than 26 years old.  The bleeding index is 'low risk'.  Positive CHADS2 values include History of HTN.  Negative CHADS2 values include Age > 58 years old.  The start date was 10/27/2007.  Anticoagulation responsible provider: Diona Browner MD, Remi Deter.  INR POC: 1.8.  Cuvette Lot#: [id number].  Exp: 02/12.    Anticoagulation Management Assessment/Plan:      The patient's current anticoagulation dose is Warfarin sodium 10 mg tabs: Take 1 tablet by mouth as directed.  The target INR is 2.0-3.0.  The next INR is due 07/02/2010.  Anticoagulation instructions were given to patient.  Results were reviewed/authorized by [name], RN.  He was notified by [name] RN.         Prior Anticoagulation Instructions: INR 2.3 Continue coumadin 10mg  once daily except 15mg  on Mondays  Current Anticoagulation Instructions: INR 1.8 Take coumadin 2 tablets tonight then resume 1 tablet once daily except 1 1/2 tablets on Mondays

## 2010-07-06 ENCOUNTER — Encounter (INDEPENDENT_AMBULATORY_CARE_PROVIDER_SITE_OTHER): Payer: BC Managed Care – PPO

## 2010-07-06 ENCOUNTER — Encounter: Payer: Self-pay | Admitting: Cardiology

## 2010-07-06 DIAGNOSIS — Z7901 Long term (current) use of anticoagulants: Secondary | ICD-10-CM

## 2010-07-06 DIAGNOSIS — I4891 Unspecified atrial fibrillation: Secondary | ICD-10-CM

## 2010-07-06 LAB — CONVERTED CEMR LAB: POC INR: 2.3

## 2010-07-16 NOTE — Medication Information (Signed)
Summary: ccr-lr  Anticoagulant Therapy  Managed by: [name], RN PCP: Karleen Hampshire Supervising MD: Dietrich Pates MD, Molly Maduro Indication 1: Atrial Fibrillation Lab Used: [facility] [place] Site: [place] INR POC 2.3  Dietary changes: no    Health status changes: no    Bleeding/hemorrhagic complications: no    Recent/future hospitalizations: no    Any changes in medication regimen? no    Recent/future dental: no  Any missed doses?: no       Is patient compliant with meds? yes       Allergies: 1)  ! * Iv Dye  Anticoagulation Management History:      The patient is taking warfarin and comes in today for a routine follow up visit.  Negative risk factors for bleeding include an age less than 21 years old.  The bleeding index is 'low risk'.  Positive CHADS2 values include History of HTN.  Negative CHADS2 values include Age > 25 years old.  The start date was 10/27/2007.  Anticoagulation responsible provider: Dietrich Pates MD, Molly Maduro.  INR POC: 2.3.  Cuvette Lot#: [id number].  Exp: 02/12.    Anticoagulation Management Assessment/Plan:      The patient's current anticoagulation dose is Warfarin sodium 10 mg tabs: Take 1 tablet by mouth as directed.  The target INR is 2.0-3.0.  The next INR is due 08/03/2010.  Anticoagulation instructions were given to patient.  Results were reviewed/authorized by [name], RN.  He was notified by [name] RN.         Prior Anticoagulation Instructions: INR 1.8 Take coumadin 2 tablets tonight then resume 1 tablet once daily except 1 1/2 tablets on Mondays  Current Anticoagulation Instructions: INR 2.3 Continue coumadin 10mg  once daily except 15mg  on Mondays

## 2010-07-17 ENCOUNTER — Encounter: Payer: Self-pay | Admitting: Cardiology

## 2010-07-17 DIAGNOSIS — I4891 Unspecified atrial fibrillation: Secondary | ICD-10-CM

## 2010-07-27 LAB — CBC
HCT: 31.5 % — ABNORMAL LOW (ref 39.0–52.0)
HCT: 37.2 % — ABNORMAL LOW (ref 39.0–52.0)
Hemoglobin: 11.4 g/dL — ABNORMAL LOW (ref 13.0–17.0)
MCHC: 33.8 g/dL (ref 30.0–36.0)
MCHC: 34.9 g/dL (ref 30.0–36.0)
MCV: 84.7 fL (ref 78.0–100.0)
MCV: 86.1 fL (ref 78.0–100.0)
Platelets: 182 10*3/uL (ref 150–400)
Platelets: 215 10*3/uL (ref 150–400)
Platelets: 220 10*3/uL (ref 150–400)
RDW: 15 % (ref 11.5–15.5)
RDW: 15.4 % (ref 11.5–15.5)
RDW: 15.4 % (ref 11.5–15.5)
WBC: 12.4 10*3/uL — ABNORMAL HIGH (ref 4.0–10.5)

## 2010-07-27 LAB — BASIC METABOLIC PANEL
BUN: 20 mg/dL (ref 6–23)
BUN: 20 mg/dL (ref 6–23)
CO2: 29 mEq/L (ref 19–32)
CO2: 32 mEq/L (ref 19–32)
Calcium: 8.5 mg/dL (ref 8.4–10.5)
Chloride: 103 mEq/L (ref 96–112)
Creatinine, Ser: 1 mg/dL (ref 0.4–1.5)
GFR calc non Af Amer: >60 mL/min (ref >60)
Glucose, Bld: 110 mg/dL — ABNORMAL HIGH (ref 70–99)
Glucose, Bld: 144 mg/dL — ABNORMAL HIGH (ref 70–99)
Potassium: 4 mEq/L (ref 3.5–5.1)

## 2010-07-27 LAB — PROTIME-INR
INR: 1.2 (ref 0.00–1.49)
INR: 1.8 — ABNORMAL HIGH (ref 0.00–1.49)
Prothrombin Time: 14.3 seconds (ref 11.6–15.2)
Prothrombin Time: 21.4 seconds — ABNORMAL HIGH (ref 11.6–15.2)

## 2010-07-27 LAB — APTT: aPTT: 34 seconds (ref 24–37)

## 2010-07-28 LAB — CBC
Platelets: 214 10*3/uL (ref 150–400)
RBC: 5.39 MIL/uL (ref 4.22–5.81)
WBC: 10.4 10*3/uL (ref 4.0–10.5)

## 2010-07-28 LAB — BASIC METABOLIC PANEL
BUN: 23 mg/dL (ref 6–23)
Calcium: 8.8 mg/dL (ref 8.4–10.5)
Chloride: 110 mEq/L (ref 96–112)
Creatinine, Ser: 0.88 mg/dL (ref 0.4–1.5)
GFR calc Af Amer: >60 mL/min (ref >60)
GFR calc non Af Amer: >60 mL/min (ref >60)

## 2010-07-28 LAB — PROTIME-INR
INR: 2 — ABNORMAL HIGH (ref 0.00–1.49)
Prothrombin Time: 24.1 seconds — ABNORMAL HIGH (ref 11.6–15.2)

## 2010-07-28 LAB — APTT: aPTT: 52 seconds — ABNORMAL HIGH (ref 24–37)

## 2010-08-03 ENCOUNTER — Encounter: Payer: BC Managed Care – PPO | Admitting: *Deleted

## 2010-08-06 ENCOUNTER — Ambulatory Visit (INDEPENDENT_AMBULATORY_CARE_PROVIDER_SITE_OTHER): Payer: BC Managed Care – PPO | Admitting: *Deleted

## 2010-08-06 DIAGNOSIS — I4891 Unspecified atrial fibrillation: Secondary | ICD-10-CM

## 2010-08-06 LAB — POCT INR: INR: 2.8

## 2010-09-01 NOTE — Op Note (Signed)
NAME:  ELMAR, ANTIGUA NO.:  [id number]   MEDICAL RECORD NO.:  [id number]          PATIENT TYPE:  INP   LOCATION:  5006                         FACILITY:  [facility]   PHYSICIAN:  Vania Rea. Supple, M.D.  DATE OF BIRTH:  [birth year]   DATE OF PROCEDURE:  12/14/2007  DATE OF DISCHARGE:                               OPERATIVE REPORT   PREOPERATIVE DIAGNOSIS:  End-stage left knee osteoarthrosis.   POSTOPERATIVE DIAGNOSIS:  End-stage left knee osteoarthrosis.   PROCEDURE:  Left cemented total knee arthroplasty using a DePuy Sigma  implant, size 5 femur, size 6 tibial tray, a 38-mm patellar button, and  a 12.5-mm thick rotating platform polyethylene insert.   SURGEON:  Vania Rea. Supple, MD   ASSISTANT:  Lucita Lora. Shuford, PA-C   ANESTHESIA:  General endotracheal as well as a femoral nerve block.   TOURNIQUET TIME:  One-hour 8 minutes.   ESTIMATED BLOOD LOSS:  150 mL.   DRAINS:  Hemovac x1.   HISTORY:  Mr. Monestime is a 60 year old gentleman who has had chronic and  progressive increasing bilateral knee pain, and functional limitations,  secondary to end-stage osteoarthrosis.  He is brought to the operating  room at this time for planned left total knee arthroplasty.   Preoperatively, I counseled Mr. Freeland on treatment options as well as  risks versus benefits thereof.  Possible surgical complications,  bleeding, infection, neurovascular injury, DVT, PE, persistent pain,  loss of motion, anesthetic complication, and possible need for revision  surgery were all reviewed.  He understands and accepts and agrees with  our planned procedure.   PROCEDURE IN DETAIL:  After undergoing routine preop evaluation, the  patient received prophylactic antibiotics.  Femoral nerve block  established in the holding area by the anesthesia department.  Placed  supine on the operative table, underwent smooth induction of general  endotracheal anesthesia.  Foley catheter placed.   Tourniquet applied  left thigh, left leg was sterilely prepped and draped in standard  fashion.  Leg was exsanguinated with the tourniquet inflated to 350  mmHg.  An anterior midline incision was then made approximately 20 cm in  length centered over the patella.  Skin flaps were elevated and  mobilized.  Medial parapatellar arthrotomy was performed.  The patella  was everted with the infrapatellar fat pad, majority being excised.  Patella everted, knee was flexed up, cruciate ligaments divided, drill  was then used to gain access to the intramedullary canal with distal  femur and intramedullary guide was then passed.  We made a 12-mm  resection from the distal femur at a 5-degree valgus.  We then measured  the distal femur and this had the best fit with a size 5, Pyramid plate  with size 5 cutting block and made the anterior-posterior chamfer cuts  on the distal femur.  We then placed a trial implant showing good fit.  Attention was then turned to the proximal tibia, which was exposed and  the remnants of the menisci and cruciate ligaments were all meticulously  divided and excised.  Extramedullary guide was then placed.  We made  a  10-mm resection, proper longitudinal alignment measured from the lateral  plateau.  Peripheral osteophytes were then removed.  The proximal tibia  showed the best coverage with a size 6 implant, which was then pinned  into position.  We performed a trial reduction with the implant showing  excellent soft tissue balance in correction of what was noted be an  approximately 25-degree flexion contracture preoperatively.  We  performed a significant medial release to correct the varus malalignment  and ultimately achieved excellent knee extension and longitudinal  alignment.  At this point, we then completed terminal preparation of the  proximal tibia with the reamer and broach.  The distal femur was then  terminally prepared using the box cutting guide.  The final  trials were  then placed and this showed excellent soft tissue balance and good  stability through a full range in knee motion.  Attention was then  turned to the patella and an oscillating saw was used to resect  approximately 9 mm of bone and the stabilizing drill holes were made for  the 38-mm patellar button.  At this point, we then used an osteotome to  remove the osteophytes on the posterior aspect of femoral condyles and  performed final removal of debris within the posterior compartments of  the knee.  The knee was then pulsatile lavaged and meticulously cleaned  of all the bony and soft tissue debris.  Cement was then mixed and the  implants were then cemented into position and meticulous removal of all  extra cement was completed.  Knee was then taken through range of motion  and again showed soft tissue balance with excellent knee stability and  mobility.  We then performed a trial with 12.5-mm implant that showed  excellent soft tissue balance.  The final 12.5-mm implant was then  implanted.  The knee was taken through one final range of motion, which  showed an excellent stability.  Hemovac drain was then brought out  superolaterally.  The tourniquet was let down.  Hemostasis was obtained.  The joint was then terminally irrigated and the incision was closed with  interrupted figure-of-eight #1 Vicryl for the parapatellar arthrotomy, 2-  0 Vicryl with a subcu, and intracuticular 3-0 Monocryl for the skin  followed by Steri-Strips.  Dry dressing wrapped of the knee followed by  Ace bandage to thigh-high support stocking.  The patient was then  extubated and taken to the recovery room in stable condition.       Vania Rea. Supple, M.D.  Electronically Signed     KMS/MEDQ  D:  12/14/2007  T:  12/15/2007  Job:  [id number]

## 2010-09-01 NOTE — Letter (Signed)
November 10, 2007    Kirk Ruths, M.D.  [address]  [place], [place] [address]   RE:  Tony [name], Tony [name]  MRN:  [id number]  /  DOB:  1950/11/14   Dear Annette Stable,   Tony [name] returns to the office for continued assessment and treatment  of atrial fibrillation.  He remains asymptomatic from cardiac  standpoint.  We have initiated anticoagulation, but at doses of 5 mg  alternating with 7.5 mg, INR remains normal.  He had a TSH that was  normal.  An echocardiogram shows moderate biatrial enlargement  suggesting chronicity of his arrhythmia, normal left ventricular  systolic function, and no significant valvular abnormalities.  Mild LVH  is present.   CURRENT MEDICATIONS:  1. Citalopram 20 mg daily.  2. Warfarin as directed.  3. Quinapril 40 mg daily.  4. Diltiazem 240 mg b.i.d.  5. Celebrex 200 mg b.i.d.  6. Tylenol p.r.n.   PHYSICAL EXAMINATION:  GENERAL:  Overweight pleasant [name] in no  acute distress.  VITAL SIGNS:  Weight is 358, 4 pounds less than at his last visit, blood  pressure 155/90, heart rate 80 and regular, respirations 14.  NECK:  No jugular venous distention; no carotid bruits.  LUNGS:  Clear.  CARDIAC:  Normal first and second heart sounds.  ABDOMEN:  Soft and nontender; no organomegaly.  EXTREMITIES:  Trace edema.   IMPRESSION:  Tony [name] is doing generally well.  I do not think that  cardioversion would likely be of lasting benefit to him.  We will  continue to adjust his anticoagulation.  Blood pressure  control is suboptimal.  Lisinopril/HCT will be substituted for  quinapril.  He will continue to monitor blood pressures at home and in  anticoagulation clinic.  I have advised Dr. Rennis Chris that he can plan to  proceed with total knee arthroplasty.  Warfarin can be discontinued 4  days before the surgery and resumed thereafter.  I will see Tony nice  [name] in 3 months.    Sincerely,      Gerrit Friends. Dietrich Pates, MD, [facility]  Electronically Signed    RMR/MedQ  DD: 11/10/2007  DT: 11/11/2007  Job #: [id number]

## 2010-09-01 NOTE — Op Note (Signed)
NAME:  Tony [name], Tony [name] NO.:  [id number]   MEDICAL RECORD NO.:  [id number]          PATIENT TYPE:  INP   LOCATION:  5020                         FACILITY:  [facility]   PHYSICIAN:  Vania Rea. Supple, M.D.  DATE OF BIRTH:  September 30, 1950   DATE OF PROCEDURE:  09/19/2008  DATE OF DISCHARGE:                               OPERATIVE REPORT   PREOPERATIVE DIAGNOSIS:  End-stage right knee osteoarthrosis.   POSTOPERATIVE DIAGNOSIS:  End-stage right knee osteoarthrosis.   PROCEDURE:  Cemented right total knee arthroplasty using a DePuy TC3,  size 5 femur, size 5 MBT revision tibial tray, a 12.5-mm thick TC3  polyethylene insert, and a 41-mm patella.   SURGEON:  Vania Rea. Supple, MD   ASSISTANT:  Tony Lora. Shuford, PA-C   ANESTHESIA:  General endotracheal as a femoral nerve block.   TOURNIQUET TIME:  101 minutes.   DRAINS:  Hemovac x1.   ESTIMATED BLOOD LOSS:  250 mL.   COMPLICATIONS:  Intraoperative injury to the medial collateral ligament,  which was identified and repaired and necessitated conversion to the TC3  implant.   HISTORY:  Tony [name] is a 60 year old gentleman with past medical  history significant for morbid obesity and has had end-stage arthrosis  in both knees and status post left total knee arthroplasty was done very  well and was brought to the room at this time for a planned right total  knee arthroplasty.   Preoperatively, I counseled Tony [name] on treatment options as well as  risks versus benefits thereof.  Possible surgical complications of  bleeding, infection, neurovascular injury, DVT, PE, persistence of pain,  loss of motion, and possible need for revision surgery were reviewed.  He understands and accepts and agrees with our planned procedure.   PROCEDURE IN DETAIL:  After undergoing routine preop evaluation, the  patient received prophylactic antibiotics.  A femoral nerve block was  established in the holding area by the Anesthesia Department.   Placed  supine on the operating table.  Foley catheter was placed.  Tourniquet  was applied to right thigh and right leg was sterilely prepped and  draped in standard fashion.  Time-out was called.  Leg was exsanguinated  with the tourniquet inflated to 400 mmHg.  An anterior midline incision  was then made approximately 20 cm in length centered over the anterior  aspect of the knee with skin flaps mobilized circumferentially.  A  medial parapatellar arthrotomy was performed.  Patella was everted.  The  majority of the fat pad was excised.  There were very prominent  osteophytes surrounding the patella as well as at the joint lines,  particularly medially.  Knee was then flexed up and remnants of the  cruciate ligaments were divided.  A drill was used to gain access to the  femoral medullary canal.  Intramedullary guide was passed.  We made a 12-  mm resection, 5-degree valgus from the distal femur using the distal  femoral cutting guide.  The distal femur was then measured and the size  5 had the best fit.  The size 5 cutting jig was  then applied.  We made  the anterior and posterior as well as chamfer cuts on the distal femur.  At this point in the process of making the chamfer cuts, we identified  and injury to the medial collateral ligament.  With this finding, we  made combinations with our implant system and anticipate conversion to a  TC3 to provide additional stability.  In addition, the MCL injury was  repaired with a series of #2 FiberWire figure-of-eight sutures, which  were placed and then were tied at the completion of the case.  We did go  ahead and make the conversion to the TC3 implant on the distal femur  with the enhanced box cut.  Attention was turned to the proximal tibia  and we made the proximal tibial cut after removing the remnants of the  menisci and cruciate ligaments.  An approximate 10 mm resection from the  proximal tibia was made.  The proximal tibia was then  measured at a 5  showing the best coverage and fit.  We went ahead and performed terminal  preparation of the proximal tibia to allow for the MBT tray implant.  Once the trials were then placed, we took the knee into range of motion  and this showed excellent soft tissue balance and correction of the  approximately 30-degree flexion contracture that had been noted  preoperatively.  The TC3 implant system did provide enhanced medial and  lateral stability as well.  We then used a curved osteotome to remove  large osteophytes from the posterior aspect of the medial and lateral  femoral condyles.  We meticulously cleared the posterior compartments of  soft tissue.  Attention was then turned to the patella where an  oscillating saw was used to resect approximately 9 mm of bone and then  stabilizing drill holes for the 41 patella was then performed.  We then  performed trial reduction of the implant, showed good soft tissue  balance and good stability.  We then removed the trials.  The joint was  pulsatile lavaged and meticulously cleaned.  Cement was then mixed and  at the appropriate consistency, all the implants were then cemented into  position with meticulous removal of all extra cement.  Once the cement  had hardened, we then again performed the trial reduction with the 10  and 12.5 poly inserts, and the 12.5 showed excellent soft tissue balance  and good stability and correction of the preoperative flexion  contracture.  We then implanted the final size 12.5 rotating platform  TC3 implant.  At this point, we then went ahead and tied the sutures  that had been placed for repair of the medial collateral ligament and  this showed excellent tension.  There was no evidence for varus and  valgus laxity at the end of the case.  Then, the tourniquet was let  down.  Hemostasis was obtained.  The parapatellar arthrotomy was closed  with a series of #1 Vicryl figure-of-eight sutures, 0 and 2-0  Vicryl  were used for the deep and superficial subcutaneous layer and  intracuticular 3-0 Monocryl for the skin followed by Steri-Strips.  Dry  dressing was applied.  As mentioned that we did bring a Hemovac drain  out superolaterally prior to closure.  The right lower extremity was  then placed in a knee immobilizer.  The patient was then awakened,  extubated, and taken to the recovery room in stable condition.      Vania Rea. Supple, M.D.  Electronically  Signed     Vania Rea. Supple, M.D.  Electronically Signed    KMS/MEDQ  D:  09/19/2008  T:  09/19/2008  Job:  [id number]

## 2010-09-01 NOTE — Letter (Signed)
October 27, 2007    Kirk Ruths, M.D.  [address]  [place], [place] [address]   RE:  Tony [name], Tony [name]  MRN:  [id number]  /  DOB:  1950/06/21   Dear Annette Stable,   It was my pleasure evaluating Mr. Klem in consultation in the office  today at your request for atrial fibrillation.  As you know, this nice  gentleman has enjoyed generally excellent health.  He has hypertension  and has been well controlled medically.  He has obesity that has been  fairly resistant to weight reduction efforts.  He smokes cigarettes,  currently one pack per day.  He has never been hospitalized nor had any  previous significant medical illnesses.  He came to your office for  medical clearance for total knee arthroplasty.  An EKG showed  asymptomatic atrial fibrillation.   Mr. Strey has not previously been evaluated by a cardiologist.  He has  not undergone any significant cardiac testing.  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  He experiences no dyspnea nor  chest discomfort.   Current medications include,  1. Citalopram 20 mg daily.  2. Warfarin as directed.  3. Quinapril 40 mg daily.  4. Diltiazem 240 mg b.i.d.  5. Celebrex 200 mg b.i.d.   He has no known allergies to medications.  He has experienced an adverse  reaction to INTRAVENOUS CONTRAST.   SOCIAL HISTORY:  Divorced with two adult children; no excessive use of  alcohol.   FAMILY HISTORY:  Father died due to dementia; mother is alive and  generally well, but has a history of hypertension.  His grandparents  also suffered from hypertension.  He has no siblings.   Review of systems is notable for negative TB skin test 2 years ago.  All  other systems reviewed and are unremarkable.   PHYSICAL EXAMINATION:  GENERAL:  Pleasant overweight gentleman in no  acute distress.  VITAL SIGNS:  The weight is 362.  Blood pressure 150/85, heart rate 85  and irregular, and respirations 16.  HEENT:  EOMs full; normal lids and  conjunctivae; normal oral mucosa.  NECK:  No jugular venous distention; normal carotid upstrokes without  bruits.  ENDOCRINE:  No thyromegaly.  HEMATOPOIETIC:  No adenopathy.  LUNGS:  Clear.  CARDIAC:  Irregular rhythm; normal first and second heart sounds; PMI  not appreciated.  ABDOMEN:  Soft and nontender; no organomegaly.  EXTREMITIES:  No edema; normal distal pulses.  NEUROLOGIC:  Symmetric strength and tone; normal cranial nerves.  SKIN:  No significant lesions.   EKG:  Atrial fibrillation with controlled ventricular response; left  anterior fascicular block; delayed R-wave progression.  No prior tracing  for comparison.   IMPRESSION:  Mr. Sena presents with atrial fibrillation, which is  asymptomatic, probably to his preceding use of diltiazem to control  hypertension.  The duration of his arrhythmia is entirely unknown.  He  has not previously been admitted to hospital nor had an EKG.  His pulse  is not so regular as one would be certain that this would be picked up  during a routine exam.  His major contributing factor must to the  occurrence of AF and its risk is hypertension.  His risk of  thromboembolism is probably approximately 3% per year.  This is  borderline for using warfarin.  I am additionally concerned by his  requirement for nonsteroidal antiinflammatory agent; however, he may not  need these medications after he  undergoes knee replacement surgery.  For  now, we will enroll him in Coumadin Clinic and adjust Coumadin dosage.  We will proceed to obtain basic laboratory studies including a TSH as  well as an echocardiogram.  I will see him  again in a few weeks.  If his tests are good and his clinical status  remains good, I will advise Dr. Rennis Chris that he can proceed with knee  replacement surgery.   Thanks so much for sending this very nice gentleman to see me.    Sincerely,      Gerrit Friends. Dietrich Pates, MD, [facility]  Electronically Signed    RMR/MedQ  DD:  10/27/2007  DT: 10/28/2007  Job #: [id number]

## 2010-09-01 NOTE — Discharge Summary (Signed)
NAME:  Tony, [name] NO.:  [id number]   MEDICAL RECORD NO.:  [id number]          PATIENT TYPE:  INP   LOCATION:  5006                         FACILITY:  [facility]   PHYSICIAN:  Vania Rea. Supple, M.D.  DATE OF BIRTH:  1950/10/10   DATE OF ADMISSION:  12/14/2007  DATE OF DISCHARGE:  12/17/2007                               DISCHARGE SUMMARY   ADMISSION DIAGNOSES:  1. End-stage osteoarthrosis of bilateral knees, left more symptomatic      than right.  2. Recent diagnosis of atrial fibrillation.  3. Hypertension.  4. Hypercholesteremia.  5. History of renal calculi.  6. Obesity.   DISCHARGE DIAGNOSES:  1. End-stage osteoarthrosis of bilateral knees, left more symptomatic      than right.  2. Recent diagnosis of atrial fibrillation.  3. Hypertension.  4. Hypercholesteremia.  5. History of renal calculi.  6. Obesity.  7. Status post left total knee arthroplasty.   OPERATION:  Left total knee arthroplasty.   SURGEON:  Vania Rea. Supple, MD   ASSISTANT:  Tony Lora. Shuford, PA-C   ANESTHESIA:  General anesthetic with femoral nerve block.   CONSULTATIONS:  None.   BRIEF HISTORY:  Tony [name] is a 60 year old gentleman who we followed  outpatient now for ongoing problems with end-stage osteoarthrosis, left  greater than right knee.  At this time, he has significant impact on his  quality of life with functional limitations and we have discussed total  knee arthroplasty and he wished to proceed.  We did get preoperative  medical clearance from his cardiologist as he had had a recent diagnosis  of atrial fibrillation.   HOSPITAL COURSE:  The patient was admitted, underwent the above-named  procedure, and tolerated this well.  All appropriate IV antibiotics and  analgesics were provided.  Postoperatively, he was resumed back on his  chronic Coumadin doses and was placed on appropriate IV antibiotics.  Postoperatively, he was placed weightbearing as tolerated with  total  knee replacement protocol.  Postoperatively, he remained hemodynamically  stable.  He began working and performed physical therapy.  He progressed  nicely and by date December 17, 2007, he was afebrile, vital signs were  stable, and hemoglobin was stable at 10.8.  His incision was clean and  dry and he had met therapy goals for discharge to home.   LABORATORY DATA:  Admission hemoglobin of 15.7, postoperatively at 12.6,  11.8, and 10.8.  Protime was found in the chart monitored by pharmacy on  his chronic Coumadin dose.  Chemistries were within normal limits on  admission.  He had mildly elevated BUN postop with 29 and 27  respectively.   CONDITION ON DISCHARGE:  Stable and improved.   DISCHARGE MEDICATIONS AND PLANS:  The patient will be discharged to  home.  Home Health PT, OT, and RN are ordered.  Weightbearing as  tolerated.  Total knee replacement protocol.  Resume his home Coumadin  dose and other home medications.  Prescriptions for Percocet and Robaxin  were provided.  Resume his regular home diet.  Follow up in our office  in 2 weeks.  He may shower on day #5 if incision is clean and dry.  Call  for any further problems.   CONDITION ON DISCHARGE:  Stable, improved.      Tracy A. [name], P.A.-C.      Vania Rea. Supple, M.D.  Electronically Signed    TAS/MEDQ  D:  02/01/2008  T:  02/01/2008  Job:  [id number]

## 2010-09-01 NOTE — Letter (Signed)
February 16, 2008    Kirk Ruths, M.D.  [address]  [place], [place] [address]   RE:  Tony [name], Tony [name]  MRN:  [id number]  /  DOB:  14-Oct-1950   Dear Annette Stable,   Tony [name] returns to the office for continued assessment and treatment  of atrial fibrillation and hypertension.  Since his last visit, he has  done generally well.  He reports no exertional dyspnea, no chest pain,  no lightheadedness, and no syncope.  He underwent left total knee  arthroplasty without difficulty or complications.  He is currently  involved in rehab, and surgery is planned on the right knee in the next  few months.  Blood pressure control has been good.  He notes no symptoms  referable to his atrial fibrillation.   Current medications include:  1. Citalopram 20 mg daily.  2. Warfarin an average of more than 10 mg daily.  3. Diltiazem CD 240 mg b.i.d.  4. Simvastatin 40 mg daily.  5. Lisinopril/hydrochlorothiazide 20/12.5 mg daily.   PHYSICAL EXAMINATION:  GENERAL:  Overweight pleasant gentleman in no  acute distress.  VITAL SIGNS:  The weight is 362, unchanged from June 2009.  Blood  pressure 120/80, heart rate 72 and irregular, respirations 14.  NECK:  No jugular venous distention; no carotid bruits.  LUNGS:  Clear.  CARDIAC:  Normal first and second heart sounds.  ABDOMEN:  Soft and nontender; no organomegaly.  EXTREMITIES:  No edema.   IMPRESSION:  Tony [name] is doing well from a cardiovascular standpoint.  His INR is still not therapeutic.  We will continue to increase  warfarin, to which she is relatively resistant.  He can  safely undergo surgery on his right knee and withhold warfarin for a few  days prior to that procedure just as was done in August.  I will plan a  return office visit in 8 months and then on an annual basis thereafter.     Sincerely,      Gerrit Friends. Dietrich Pates, MD, [name]  Electronically Signed    RMR/MedQ  DD: 02/16/2008  DT: 02/16/2008  Job #: [id number]

## 2010-09-01 NOTE — Discharge Summary (Signed)
NAME:  Tony [name], Tony [name] NO.:  [id number]   MEDICAL RECORD NO.:  [id number]          PATIENT TYPE:  INP   LOCATION:  5020                         FACILITY:  [facility]   PHYSICIAN:  Vania Rea. Supple, M.D.  DATE OF BIRTH:  [birth year]   DATE OF ADMISSION:  09/19/2008  DATE OF DISCHARGE:  09/22/2008                               DISCHARGE SUMMARY   ADMISSION DIAGNOSES:  1. End-stage osteoarthrosis, right knee.  2. Atrial fibrillation.  3. Hypertension.  4. Obesity.   DISCHARGE DIAGNOSES:  1. End-stage osteoarthrosis, right knee.  2. Atrial fibrillation.  3. Hypertension.  4. Obesity.  5. Status post right total knee arthroplasty.   OPERATIONS:  Right total knee arthroplasty with medial collateral  ligament medial collateral ligament repair.   SURGEON:  Vania Rea. Supple, MD   ASSISTANT:  Lucita Lora. Shuford, PA-C   ANESTHESIA:  General anesthetic with femoral nerve block.   BRIEF HISTORY:  Mr. Hardcastle is a pleasant 60 year old gentleman who  followed by [place] outpatient for ongoing problems with generalized  arthritis.  He has undergone a prior left total knee arthroplasty and  recovered nicely neck from that.  He also has known osteoarthrosis  within the glenohumeral joints of his right greater than left shoulder.  His right knee, however, has failed to outpatient conservative  management and at this time he has significant bone-on-bone deformity.  Risks and benefits were discussed and he wished to proceed with total  knee arthroplasty as he had done extremely well with his previous  surgery.   HOSPITAL COURSE:  The patient is admitted, underwent the above-named  procedure, and tolerated this well.  All appropriate IV antibiotics and  analgesics were utilized.  Perioperatively, he was placed in TROM brace  as he did incur an intraoperative MCL injury that was primarily repaired  and we had converted to a more stabilize component.  For this reason,  hinged brace was to  be utilized.  Postoperatively, he resumed on his  chronic Coumadin use.  He is on this for his atrial fibrillation and  also for his DVT and PE prophylaxis.  He was given few Lovenox doses  while inpatient.  The patient did extremely well with physical therapy.  He remained hemodynamically stable.  By date September 22, 2008, he had met  all therapy goals on his pain medicine.  He was on oral analgesics at  this time and was doing well with physical therapy.  At this time, his  incision was clean and dry.  Hemoglobin was stable at 10.6.  He was felt  to be stable for discharge to home.   LABORATORY VALUES:  His preoperative hemoglobin was, however, 15.4,  postoperatively 12.6, 11.4, and 10.6.  Protimes followed by Pharmacy, on  Coumadin.  EKG showed atrial fibrillation.  Chest x-ray not available at  the time of dictation.   CONDITION ON DISCHARGE:  Stable and improved.   DISCHARGE PLANS:  The patient had been discharged to home.  Prescriptions for Percocet, Robaxin, and Restoril provided.  He should  follow up in 2 weeks, call for time.  Resume his other home meds and  home diet.  TROM brace should be worn.  It can be unlocked for therapy  and motion.  He can be weightbearing as tolerated.  He may shower on day  #5.  He already has all of his durable medical equipment at home.      Tracy A. Shuford, P.A.-C.      Vania Rea. Supple, M.D.  Electronically Signed    TAS/MEDQ  D:  10/10/2008  T:  10/11/2008  Job:  [id number]

## 2010-09-03 ENCOUNTER — Ambulatory Visit (INDEPENDENT_AMBULATORY_CARE_PROVIDER_SITE_OTHER): Payer: BC Managed Care – PPO | Admitting: *Deleted

## 2010-09-03 DIAGNOSIS — I4891 Unspecified atrial fibrillation: Secondary | ICD-10-CM

## 2010-09-03 LAB — POCT INR: INR: 2.1

## 2010-09-04 ENCOUNTER — Other Ambulatory Visit: Payer: Self-pay | Admitting: Cardiology

## 2010-09-24 ENCOUNTER — Ambulatory Visit (INDEPENDENT_AMBULATORY_CARE_PROVIDER_SITE_OTHER): Payer: BC Managed Care – PPO | Admitting: *Deleted

## 2010-09-24 DIAGNOSIS — I4891 Unspecified atrial fibrillation: Secondary | ICD-10-CM

## 2010-10-22 ENCOUNTER — Encounter: Payer: BC Managed Care – PPO | Admitting: *Deleted

## 2010-10-30 ENCOUNTER — Ambulatory Visit (INDEPENDENT_AMBULATORY_CARE_PROVIDER_SITE_OTHER): Payer: BC Managed Care – PPO | Admitting: *Deleted

## 2010-10-30 DIAGNOSIS — I4891 Unspecified atrial fibrillation: Secondary | ICD-10-CM

## 2010-11-26 ENCOUNTER — Ambulatory Visit (INDEPENDENT_AMBULATORY_CARE_PROVIDER_SITE_OTHER): Payer: BC Managed Care – PPO | Admitting: *Deleted

## 2010-11-26 DIAGNOSIS — I4891 Unspecified atrial fibrillation: Secondary | ICD-10-CM

## 2010-11-26 LAB — POCT INR: INR: 2.4

## 2010-12-24 ENCOUNTER — Ambulatory Visit (INDEPENDENT_AMBULATORY_CARE_PROVIDER_SITE_OTHER): Payer: BC Managed Care – PPO | Admitting: *Deleted

## 2010-12-24 DIAGNOSIS — I4891 Unspecified atrial fibrillation: Secondary | ICD-10-CM

## 2011-01-02 ENCOUNTER — Other Ambulatory Visit: Payer: Self-pay | Admitting: Cardiology

## 2011-01-20 ENCOUNTER — Ambulatory Visit (INDEPENDENT_AMBULATORY_CARE_PROVIDER_SITE_OTHER): Payer: BC Managed Care – PPO | Admitting: *Deleted

## 2011-01-20 DIAGNOSIS — I4891 Unspecified atrial fibrillation: Secondary | ICD-10-CM

## 2011-01-21 ENCOUNTER — Encounter: Payer: BC Managed Care – PPO | Admitting: *Deleted

## 2011-02-08 ENCOUNTER — Other Ambulatory Visit: Payer: Self-pay | Admitting: *Deleted

## 2011-02-08 ENCOUNTER — Telehealth: Payer: Self-pay | Admitting: *Deleted

## 2011-02-08 MED ORDER — DILTIAZEM HCL ER COATED BEADS 240 MG PO CP24: 240.0000 mg | ORAL_CAPSULE | Freq: Two times a day (BID) | ORAL | Status: DC

## 2011-02-08 NOTE — Telephone Encounter (Signed)
Pre authorization completed for Diltiazem.  Valid until October 2013.

## 2011-02-17 ENCOUNTER — Encounter: Payer: BC Managed Care – PPO | Admitting: *Deleted

## 2011-02-18 ENCOUNTER — Encounter: Payer: BC Managed Care – PPO | Admitting: *Deleted

## 2011-02-24 ENCOUNTER — Ambulatory Visit (INDEPENDENT_AMBULATORY_CARE_PROVIDER_SITE_OTHER): Payer: BC Managed Care – PPO | Admitting: *Deleted

## 2011-02-24 DIAGNOSIS — I4891 Unspecified atrial fibrillation: Secondary | ICD-10-CM

## 2011-02-24 DIAGNOSIS — Z7901 Long term (current) use of anticoagulants: Secondary | ICD-10-CM

## 2011-02-24 LAB — POCT INR: INR: 2.3

## 2011-03-25 ENCOUNTER — Ambulatory Visit (INDEPENDENT_AMBULATORY_CARE_PROVIDER_SITE_OTHER): Payer: BC Managed Care – PPO | Admitting: *Deleted

## 2011-03-25 DIAGNOSIS — I4891 Unspecified atrial fibrillation: Secondary | ICD-10-CM

## 2011-03-25 DIAGNOSIS — Z7901 Long term (current) use of anticoagulants: Secondary | ICD-10-CM

## 2011-03-25 LAB — POCT INR: INR: 2

## 2011-04-19 ENCOUNTER — Encounter: Payer: Self-pay | Admitting: Cardiology

## 2011-05-03 ENCOUNTER — Encounter: Payer: Self-pay | Admitting: Cardiology

## 2011-05-03 ENCOUNTER — Ambulatory Visit (INDEPENDENT_AMBULATORY_CARE_PROVIDER_SITE_OTHER): Payer: BC Managed Care – PPO | Admitting: *Deleted

## 2011-05-03 ENCOUNTER — Ambulatory Visit (INDEPENDENT_AMBULATORY_CARE_PROVIDER_SITE_OTHER): Payer: BC Managed Care – PPO | Admitting: Cardiology

## 2011-05-03 DIAGNOSIS — I10 Essential (primary) hypertension: Secondary | ICD-10-CM | POA: Insufficient documentation

## 2011-05-03 DIAGNOSIS — M1990 Unspecified osteoarthritis, unspecified site: Secondary | ICD-10-CM | POA: Insufficient documentation

## 2011-05-03 DIAGNOSIS — E785 Hyperlipidemia, unspecified: Secondary | ICD-10-CM | POA: Insufficient documentation

## 2011-05-03 DIAGNOSIS — F17200 Nicotine dependence, unspecified, uncomplicated: Secondary | ICD-10-CM

## 2011-05-03 DIAGNOSIS — Z7901 Long term (current) use of anticoagulants: Secondary | ICD-10-CM | POA: Insufficient documentation

## 2011-05-03 DIAGNOSIS — I4891 Unspecified atrial fibrillation: Secondary | ICD-10-CM

## 2011-05-03 DIAGNOSIS — E669 Obesity, unspecified: Secondary | ICD-10-CM | POA: Insufficient documentation

## 2011-05-03 LAB — POCT INR: INR: 1.9

## 2011-05-03 NOTE — Assessment & Plan Note (Signed)
Hyperlipidemia requires repeat assessment.  A fasting lipid profile is pending.

## 2011-05-03 NOTE — Patient Instructions (Signed)
Your physician recommends that you schedule a follow-up appointment in: 12 months  Your physician recommends that you return for lab work in: This week  Stool x 3 for blood and return to office as soon as possible

## 2011-05-03 NOTE — Assessment & Plan Note (Signed)
No evidence for occult GI blood loss in the past.  We will continue to monitor CBC and stool Hemoccults.  INRs have been stable and therapeutic.

## 2011-05-03 NOTE — Progress Notes (Signed)
Patient ID: Tony [name], male   DOB: 1950/10/18, 61 y.o.   MRN: [id number] HPI: Scheduled return visit for this nice gentleman with obesity and atrial fibrillation.  Since last visit, he has done quite well.  He reports no palpitations, no lightheadedness nor syncope.  Blood pressure control has been good.  He continues to have problems with his right shoulder and believes that a steroid injection will soon be recommended.  That was accomplished in the past without interruption of anticoagulation.  He has been stressed by a prolonged illness in the family (mother) for the past 6 months resulting in overeating and increased consumption of beer.  He continues to smoke cigarettes at the rate of one half pack per day.  Prior to Admission medications   Medication Sig Start Date End Date Taking? Authorizing Provider  acetaminophen (TYLENOL) 500 MG tablet Take 500 mg by mouth every 6 (six) hours as needed.     Yes Historical Provider, MD  diltiazem (CARDIZEM CD) 240 MG 24 hr capsule Take 1 capsule (240 mg total) by mouth 2 (two) times daily. 02/08/11  Yes Gerrit Friends. Arbie Reisz, MD  lisinopril-hydrochlorothiazide (PRINZIDE,ZESTORETIC) 20-12.5 MG per tablet Take 2 tablets by mouth daily.  11/26/10  Yes Historical Provider, MD  simvastatin (ZOCOR) 40 MG tablet  11/26/10  Yes Historical Provider, MD  warfarin (COUMADIN) 10 MG tablet take 1 tablet AS DIRECTED. 01/02/11  Yes Gerrit Friends. Dietrich Pates, MD    Allergies  Allergen Reactions  . Iodinated Diagnostic Agents   Past medical history, social history, and family history reviewed and updated.  ROS: See history of present illness.  PHYSICAL EXAM: BP 133/87  Pulse 76  Ht 5\' 11"  (1.803 m)  Wt 178.717 kg (394 lb)  BMI 54.95 kg/m2; 10 pound weight gain since his last visit. General-Well developed; no acute distress Body habitus-obese Neck-No JVD; no carotid bruits Lungs-clear lung fields; resonant to percussion; decreased breath sounds at the  bases Cardiovascular- PMI not palpable; distant S1 and S2; irregular rhythm Abdomen-normal bowel sounds; soft and non-tender without masses or organomegaly Musculoskeletal-No deformities, no cyanosis or clubbing Neurologic-Normal cranial nerves; symmetric strength and tone Skin-Warm, no significant lesions Extremities-distal pulses intact; 1/2+ edema  ASSESSMENT AND PLAN:   Bing, MD 05/03/2011 4:33 PM

## 2011-05-03 NOTE — Assessment & Plan Note (Signed)
Patient advised to concentrate on weight loss.  We will address cigarette smoking as a secondary priority.

## 2011-05-04 ENCOUNTER — Ambulatory Visit: Payer: BC Managed Care – PPO | Admitting: Cardiology

## 2011-05-06 ENCOUNTER — Encounter: Payer: BC Managed Care – PPO | Admitting: *Deleted

## 2011-05-13 LAB — LIPID PANEL
LDL Cholesterol: 90 mg/dL (ref 0–99)
VLDL: 18 mg/dL (ref 0–40)

## 2011-05-13 LAB — COMPREHENSIVE METABOLIC PANEL
ALT: 14 U/L (ref 0–53)
AST: 21 U/L (ref 0–37)
Albumin: 3.9 g/dL (ref 3.5–5.2)
Alkaline Phosphatase: 97 U/L (ref 39–117)
BUN: 27 mg/dL — ABNORMAL HIGH (ref 6–23)
Calcium: 9 mg/dL (ref 8.4–10.5)
Chloride: 104 mEq/L (ref 96–112)
Potassium: 4.1 mEq/L (ref 3.5–5.3)

## 2011-05-13 LAB — CBC
HCT: 46.4 % (ref 39.0–52.0)
Hemoglobin: 14.9 g/dL (ref 13.0–17.0)
MCH: 29 pg (ref 26.0–34.0)
MCHC: 32.1 g/dL (ref 30.0–36.0)
RDW: 15.3 % (ref 11.5–15.5)

## 2011-05-31 ENCOUNTER — Encounter (INDEPENDENT_AMBULATORY_CARE_PROVIDER_SITE_OTHER): Payer: BC Managed Care – PPO | Admitting: *Deleted

## 2011-05-31 ENCOUNTER — Ambulatory Visit (INDEPENDENT_AMBULATORY_CARE_PROVIDER_SITE_OTHER): Payer: BC Managed Care – PPO | Admitting: *Deleted

## 2011-05-31 ENCOUNTER — Other Ambulatory Visit: Payer: Self-pay

## 2011-05-31 DIAGNOSIS — Z7901 Long term (current) use of anticoagulants: Secondary | ICD-10-CM

## 2011-05-31 DIAGNOSIS — I4891 Unspecified atrial fibrillation: Secondary | ICD-10-CM

## 2011-06-01 ENCOUNTER — Encounter: Payer: Self-pay | Admitting: *Deleted

## 2011-06-10 ENCOUNTER — Other Ambulatory Visit: Payer: Self-pay | Admitting: Cardiology

## 2011-06-30 ENCOUNTER — Ambulatory Visit (INDEPENDENT_AMBULATORY_CARE_PROVIDER_SITE_OTHER): Payer: BC Managed Care – PPO | Admitting: *Deleted

## 2011-06-30 DIAGNOSIS — I4891 Unspecified atrial fibrillation: Secondary | ICD-10-CM

## 2011-06-30 LAB — POCT INR: INR: 3.1

## 2011-07-29 ENCOUNTER — Ambulatory Visit (INDEPENDENT_AMBULATORY_CARE_PROVIDER_SITE_OTHER): Payer: BC Managed Care – PPO | Admitting: *Deleted

## 2011-07-29 DIAGNOSIS — I4891 Unspecified atrial fibrillation: Secondary | ICD-10-CM

## 2011-07-29 LAB — POCT INR: INR: 3

## 2011-07-31 ENCOUNTER — Other Ambulatory Visit: Payer: Self-pay | Admitting: Cardiology

## 2011-09-01 ENCOUNTER — Ambulatory Visit (INDEPENDENT_AMBULATORY_CARE_PROVIDER_SITE_OTHER): Payer: BC Managed Care – PPO | Admitting: *Deleted

## 2011-09-01 DIAGNOSIS — I4891 Unspecified atrial fibrillation: Secondary | ICD-10-CM

## 2011-09-01 LAB — POCT INR: INR: 3.2

## 2011-09-29 ENCOUNTER — Ambulatory Visit (INDEPENDENT_AMBULATORY_CARE_PROVIDER_SITE_OTHER): Payer: BC Managed Care – PPO | Admitting: *Deleted

## 2011-09-29 DIAGNOSIS — I4891 Unspecified atrial fibrillation: Secondary | ICD-10-CM

## 2011-11-04 ENCOUNTER — Ambulatory Visit (INDEPENDENT_AMBULATORY_CARE_PROVIDER_SITE_OTHER): Payer: BC Managed Care – PPO | Admitting: *Deleted

## 2011-11-04 DIAGNOSIS — I4891 Unspecified atrial fibrillation: Secondary | ICD-10-CM

## 2011-12-02 ENCOUNTER — Ambulatory Visit (INDEPENDENT_AMBULATORY_CARE_PROVIDER_SITE_OTHER): Payer: BC Managed Care – PPO | Admitting: *Deleted

## 2011-12-02 DIAGNOSIS — I4891 Unspecified atrial fibrillation: Secondary | ICD-10-CM

## 2011-12-27 ENCOUNTER — Ambulatory Visit (INDEPENDENT_AMBULATORY_CARE_PROVIDER_SITE_OTHER): Payer: BC Managed Care – PPO | Admitting: *Deleted

## 2011-12-27 DIAGNOSIS — I4891 Unspecified atrial fibrillation: Secondary | ICD-10-CM

## 2012-01-17 ENCOUNTER — Ambulatory Visit (INDEPENDENT_AMBULATORY_CARE_PROVIDER_SITE_OTHER): Payer: BC Managed Care – PPO | Admitting: *Deleted

## 2012-01-17 DIAGNOSIS — I4891 Unspecified atrial fibrillation: Secondary | ICD-10-CM

## 2012-02-14 ENCOUNTER — Ambulatory Visit (INDEPENDENT_AMBULATORY_CARE_PROVIDER_SITE_OTHER): Payer: BC Managed Care – PPO | Admitting: *Deleted

## 2012-02-14 DIAGNOSIS — I4891 Unspecified atrial fibrillation: Secondary | ICD-10-CM

## 2012-03-09 ENCOUNTER — Other Ambulatory Visit: Payer: Self-pay | Admitting: Cardiology

## 2012-03-13 ENCOUNTER — Ambulatory Visit (INDEPENDENT_AMBULATORY_CARE_PROVIDER_SITE_OTHER): Payer: BC Managed Care – PPO | Admitting: *Deleted

## 2012-03-13 ENCOUNTER — Other Ambulatory Visit: Payer: Self-pay | Admitting: Cardiology

## 2012-03-13 DIAGNOSIS — I4891 Unspecified atrial fibrillation: Secondary | ICD-10-CM

## 2012-03-13 MED ORDER — DILTIAZEM HCL ER COATED BEADS 240 MG PO CP24: 240.0000 mg | ORAL_CAPSULE | Freq: Two times a day (BID) | ORAL | Status: DC

## 2012-03-26 ENCOUNTER — Other Ambulatory Visit: Payer: Self-pay | Admitting: Cardiology

## 2012-04-07 ENCOUNTER — Telehealth: Payer: Self-pay | Admitting: *Deleted

## 2012-04-07 NOTE — Telephone Encounter (Signed)
Pre Authorization completed on Diltiazem ER 240 mg bid.  In effect until 03/2013

## 2012-04-10 ENCOUNTER — Encounter: Payer: Self-pay | Admitting: Cardiology

## 2012-04-10 ENCOUNTER — Ambulatory Visit (INDEPENDENT_AMBULATORY_CARE_PROVIDER_SITE_OTHER): Payer: BC Managed Care – PPO | Admitting: Cardiology

## 2012-04-10 ENCOUNTER — Encounter: Payer: Self-pay | Admitting: *Deleted

## 2012-04-10 ENCOUNTER — Ambulatory Visit (INDEPENDENT_AMBULATORY_CARE_PROVIDER_SITE_OTHER): Payer: BC Managed Care – PPO | Admitting: *Deleted

## 2012-04-10 VITALS — BP 118/88 | HR 87 | Wt 355.0 lb

## 2012-04-10 DIAGNOSIS — I4891 Unspecified atrial fibrillation: Secondary | ICD-10-CM

## 2012-04-10 DIAGNOSIS — I10 Essential (primary) hypertension: Secondary | ICD-10-CM

## 2012-04-10 DIAGNOSIS — Z7901 Long term (current) use of anticoagulants: Secondary | ICD-10-CM

## 2012-04-10 DIAGNOSIS — E785 Hyperlipidemia, unspecified: Secondary | ICD-10-CM

## 2012-04-10 DIAGNOSIS — E669 Obesity, unspecified: Secondary | ICD-10-CM

## 2012-04-10 DIAGNOSIS — F17200 Nicotine dependence, unspecified, uncomplicated: Secondary | ICD-10-CM

## 2012-04-10 MED ORDER — ATORVASTATIN CALCIUM 40 MG PO TABS: 40.0000 mg | ORAL_TABLET | Freq: Every day | ORAL | Status: DC

## 2012-04-10 NOTE — Progress Notes (Signed)
HPI: Scheduled return visit for this nice gentleman with chronic atrial fibrillation and multiple vascular risk factors in the absence of known vascular disease.  He has felt poorly lately, now just starting to recover from her recent upper respiratory infection that was treated with an antitussive and antibiotics.  Otherwise, he is maintaining an active lifestyle without exertion-related chest discomfort or dyspnea.  Prior to Admission medications   Medication Sig Start Date End Date Taking? Authorizing Provider  acetaminophen (TYLENOL) 500 MG tablet Take 500 mg by mouth every 6 (six) hours as needed.     Yes Historical Provider, MD  cefdinir (OMNICEF) 300 MG capsule Take 300 mg by mouth 2 (two) times daily.   Yes Historical Provider, MD  diltiazem (CARDIZEM CD) 240 MG 24 hr capsule Take 1 capsule (240 mg total) by mouth 2 (two) times daily. 03/13/12  Yes Kathlen Brunswick, MD  lisinopril-hydrochlorothiazide (PRINZIDE,ZESTORETIC) 20-12.5 MG per tablet TAKE 2 TABLETS ONCE A DAY 06/10/11  Yes Kathlen Brunswick, MD  simvastatin (ZOCOR) 40 MG tablet Take 40 mg by mouth every evening.   Yes Historical Provider, MD  warfarin (COUMADIN) 10 MG tablet take 1 tablet AS DIRECTED. 03/26/12  Yes Kathlen Brunswick, MD   Allergies  Allergen Reactions  . Iodinated Diagnostic Agents      Past medical history, social history, and family history reviewed and updated.  ROS: He has had no palpitations, orthopnea, PND, syncope or peripheral edema.  He has attempted to restrict calorie intake and has lost some weight.  All other systems reviewed and are negative.  PHYSICAL EXAM: BP 118/88  Pulse 87  Wt 161.027 kg (355 lb)  SpO2 92% ; weight has decreased 39 pounds since 04/2011 General-Well developed; no acute distress Body habitus-obese Neck-No JVD; no carotid bruits Lungs-clear lung fields; resonant to percussion Cardiovascular-normal PMI; distant S1 and S2; irregular rhythm Abdomen-normal bowel sounds; soft  and non-tender without masses or organomegaly Musculoskeletal-No deformities, no cyanosis or clubbing Neurologic-Normal cranial nerves; symmetric strength and tone Skin-Warm, no significant lesions Extremities-distal pulses intact; 1/2+ edema; mild chronic stasis changes in the skin  EKG: Atrial fibrillation with a controlled ventricular response; ventricular rate of 97 bpm; 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   MRN: [id number]

## 2012-04-10 NOTE — Progress Notes (Deleted)
Name: Tony [name]    DOB: December 27, 1950  Age: 61 y.o.  MR#: [id number]       PCP:  Kirk Ruths, MD      Insurance: @PAYORNAME @   CC:   No chief complaint on file.  MEDICATION BOTTLES STOOL CARDS NEGATIVE 05/31/2011 NO RECENT LAB   VS BP 118/88  Pulse 87  Wt 355 lb (161.027 kg)  SpO2 92%  Weights Current Weight  04/10/12 355 lb (161.027 kg)  05/03/11 394 lb (178.717 kg)  05/07/10 384 lb (174.181 kg)    Blood Pressure  BP Readings from Last 3 Encounters:  04/10/12 118/88  05/03/11 133/87  05/07/10 141/90     Admit date:  (Not on file) Last encounter with [facility]:  03/26/2012   Allergy Allergies  Allergen Reactions  . Iodinated Diagnostic Agents     Current Outpatient Prescriptions  Medication Sig Dispense Refill  . acetaminophen (TYLENOL) 500 MG tablet Take 500 mg by mouth every 6 (six) hours as needed.        . cefdinir (OMNICEF) 300 MG capsule Take 300 mg by mouth 2 (two) times daily.      [name] diltiazem (CARDIZEM CD) 240 MG 24 hr capsule Take 1 capsule (240 mg total) by mouth 2 (two) times daily.  60 capsule  12  . lisinopril-hydrochlorothiazide (PRINZIDE,ZESTORETIC) 20-12.5 MG per tablet TAKE 2 TABLETS ONCE A DAY  60 tablet  11  . simvastatin (ZOCOR) 40 MG tablet Take 40 mg by mouth every evening.      . warfarin (COUMADIN) 10 MG tablet take 1 tablet AS DIRECTED.  30 tablet  3    Discontinued Meds:    Medications Discontinued During This Encounter  Medication Reason  . simvastatin (ZOCOR) 40 MG tablet Change in therapy    Patient Active Problem List  Diagnosis  . OBESITY  . TOBACCO ABUSE  . Atrial fibrillation  . Hypertension  . Hyperlipidemia  . Chronic anticoagulation  . Degenerative joint disease  . Obesity    LABS Anti-coag visit on 03/13/2012  Component Date Value  . INR 03/13/2012 2.5   Anti-coag visit on 02/14/2012  Component Date Value  . INR 02/14/2012 2.0   Anti-coag visit on 01/17/2012  Component Date Value  . INR 01/17/2012 2.7       Results for this Opt Visit:     Results for orders placed in visit on 03/13/12  POCT INR      Component Value Range   INR 2.5      EKG Orders placed in visit on 04/10/12  . EKG 12-LEAD     Prior Assessment and Plan Problem List as of 04/10/2012            Cardiology Problems   Atrial fibrillation   Hypertension   Hyperlipidemia   Last Assessment & Plan Note   05/03/2011 Office Visit Signed 05/03/2011  4:39 PM by Kathlen Brunswick, MD    Hyperlipidemia requires repeat assessment.  A fasting lipid profile is pending.      Other   OBESITY   TOBACCO ABUSE   Last Assessment & Plan Note   05/03/2011 Office Visit Signed 05/03/2011  4:38 PM by Kathlen Brunswick, MD    Patient advised to concentrate on weight loss.  We will address cigarette smoking as a secondary priority.    Chronic anticoagulation   Last Assessment & Plan Note   05/03/2011 Office Visit Signed 05/03/2011  4:40 PM by Kathlen Brunswick, MD  No evidence for occult GI blood loss in the past.  We will continue to monitor CBC and stool Hemoccults.  INRs have been stable and therapeutic.    Degenerative joint disease   Obesity       Imaging: No results found.   FRS Calculation: Score not calculated. Missing: Total Cholesterol

## 2012-04-10 NOTE — Patient Instructions (Addendum)
Your physician recommends that you schedule a follow-up appointment in: 1 year  Your physician recommends that you return for lab work in: February  Your physician has recommended you make the following change in your medication:  1 - STOP Simvastatin after you complete current bottle then START Atorvastatin in its place at 40 mg daily

## 2012-04-11 NOTE — Assessment & Plan Note (Signed)
Congratulated on weight loss to date and encouraged to continue.  Unfortunately, goal includes an additional weight loss of 100 pounds.

## 2012-04-11 NOTE — Assessment & Plan Note (Signed)
Patient expresses no interest in further attempts to discontinue tobacco use.

## 2012-04-11 NOTE — Assessment & Plan Note (Signed)
No apparent complications related to chronic anticoagulation.  INRs been stable and therapeutic.  We will continue to monitor CBCs and FOBT.

## 2012-04-11 NOTE — Assessment & Plan Note (Signed)
Heart rate is well controlled, and patient is asymptomatic with respect to his arrhythmia.  We will continue our strategy of heart rate control and anticoagulation.

## 2012-04-11 NOTE — Assessment & Plan Note (Signed)
No blood pressure elevations noted in the last 3 years; current therapy is effective and will be continued.

## 2012-04-11 NOTE — Assessment & Plan Note (Signed)
Recent lipid profile was good; however, simvastatin is relatively contraindicated in conjunction with diltiazem.  Atorvastatin will be substituted at a dose of 40 mg per day and a lipid profile and reassess thereafter.

## 2012-05-25 ENCOUNTER — Ambulatory Visit (INDEPENDENT_AMBULATORY_CARE_PROVIDER_SITE_OTHER): Payer: BC Managed Care – PPO | Admitting: *Deleted

## 2012-05-25 DIAGNOSIS — I4891 Unspecified atrial fibrillation: Secondary | ICD-10-CM

## 2012-05-25 LAB — POCT INR: INR: 2.7

## 2012-06-05 ENCOUNTER — Other Ambulatory Visit: Payer: Self-pay | Admitting: Cardiology

## 2012-06-06 ENCOUNTER — Other Ambulatory Visit: Payer: Self-pay | Admitting: Cardiology

## 2012-06-06 LAB — CBC
HCT: 42.3 % (ref 39.0–52.0)
Hemoglobin: 14.5 g/dL (ref 13.0–17.0)
MCH: 28.3 pg (ref 26.0–34.0)
MCHC: 34.3 g/dL (ref 30.0–36.0)
MCV: 82.6 fL (ref 78.0–100.0)

## 2012-06-06 LAB — COMPREHENSIVE METABOLIC PANEL
Albumin: 3.8 g/dL (ref 3.5–5.2)
Alkaline Phosphatase: 112 U/L (ref 39–117)
BUN: 16 mg/dL (ref 6–23)
Calcium: 9.2 mg/dL (ref 8.4–10.5)
Glucose, Bld: 114 mg/dL — ABNORMAL HIGH (ref 70–99)
Potassium: 4.5 mEq/L (ref 3.5–5.3)

## 2012-06-06 LAB — LIPID PANEL
HDL: 37 mg/dL — ABNORMAL LOW (ref >39)
LDL Cholesterol: 85 mg/dL (ref 0–99)
Triglycerides: 74 mg/dL (ref <150)

## 2012-06-07 ENCOUNTER — Encounter: Payer: Self-pay | Admitting: Cardiology

## 2012-06-07 ENCOUNTER — Other Ambulatory Visit: Payer: Self-pay | Admitting: *Deleted

## 2012-06-07 DIAGNOSIS — I4891 Unspecified atrial fibrillation: Secondary | ICD-10-CM

## 2012-06-07 DIAGNOSIS — E785 Hyperlipidemia, unspecified: Secondary | ICD-10-CM

## 2012-06-07 DIAGNOSIS — Z7901 Long term (current) use of anticoagulants: Secondary | ICD-10-CM

## 2012-06-09 ENCOUNTER — Encounter: Payer: Self-pay | Admitting: *Deleted

## 2012-07-12 ENCOUNTER — Ambulatory Visit (INDEPENDENT_AMBULATORY_CARE_PROVIDER_SITE_OTHER): Payer: BC Managed Care – PPO | Admitting: *Deleted

## 2012-07-12 DIAGNOSIS — I4891 Unspecified atrial fibrillation: Secondary | ICD-10-CM

## 2012-07-18 ENCOUNTER — Other Ambulatory Visit: Payer: Self-pay | Admitting: Cardiology

## 2012-08-23 ENCOUNTER — Ambulatory Visit (INDEPENDENT_AMBULATORY_CARE_PROVIDER_SITE_OTHER): Payer: BC Managed Care – PPO | Admitting: *Deleted

## 2012-08-23 DIAGNOSIS — I4891 Unspecified atrial fibrillation: Secondary | ICD-10-CM

## 2012-08-23 LAB — POCT INR: INR: 2.7

## 2012-10-04 ENCOUNTER — Ambulatory Visit (INDEPENDENT_AMBULATORY_CARE_PROVIDER_SITE_OTHER): Payer: BC Managed Care – PPO | Admitting: *Deleted

## 2012-10-04 DIAGNOSIS — I4891 Unspecified atrial fibrillation: Secondary | ICD-10-CM

## 2012-10-04 LAB — POCT INR: INR: 3

## 2012-10-15 ENCOUNTER — Emergency Department (HOSPITAL_COMMUNITY)
Admission: EM | Admit: 2012-10-15 | Discharge: 2012-10-15 | Disposition: A | Discharge status: Home / Self Care | Payer: BC Managed Care – PPO | Attending: Emergency Medicine | Admitting: Emergency Medicine

## 2012-10-15 ENCOUNTER — Encounter (HOSPITAL_COMMUNITY): Payer: Self-pay | Admitting: *Deleted

## 2012-10-15 ENCOUNTER — Emergency Department (HOSPITAL_COMMUNITY): Payer: BC Managed Care – PPO

## 2012-10-15 DIAGNOSIS — S0990XA Unspecified injury of head, initial encounter: Secondary | ICD-10-CM | POA: Insufficient documentation

## 2012-10-15 DIAGNOSIS — I10 Essential (primary) hypertension: Secondary | ICD-10-CM | POA: Insufficient documentation

## 2012-10-15 DIAGNOSIS — E785 Hyperlipidemia, unspecified: Secondary | ICD-10-CM | POA: Insufficient documentation

## 2012-10-15 DIAGNOSIS — R799 Abnormal finding of blood chemistry, unspecified: Secondary | ICD-10-CM | POA: Insufficient documentation

## 2012-10-15 DIAGNOSIS — Z7901 Long term (current) use of anticoagulants: Secondary | ICD-10-CM | POA: Insufficient documentation

## 2012-10-15 DIAGNOSIS — Z79899 Other long term (current) drug therapy: Secondary | ICD-10-CM | POA: Insufficient documentation

## 2012-10-15 DIAGNOSIS — Z23 Encounter for immunization: Secondary | ICD-10-CM | POA: Insufficient documentation

## 2012-10-15 DIAGNOSIS — E669 Obesity, unspecified: Secondary | ICD-10-CM | POA: Insufficient documentation

## 2012-10-15 DIAGNOSIS — Z8739 Personal history of other diseases of the musculoskeletal system and connective tissue: Secondary | ICD-10-CM | POA: Insufficient documentation

## 2012-10-15 DIAGNOSIS — S0100XA Unspecified open wound of scalp, initial encounter: Secondary | ICD-10-CM | POA: Insufficient documentation

## 2012-10-15 DIAGNOSIS — F17200 Nicotine dependence, unspecified, uncomplicated: Secondary | ICD-10-CM | POA: Insufficient documentation

## 2012-10-15 DIAGNOSIS — Z8679 Personal history of other diseases of the circulatory system: Secondary | ICD-10-CM | POA: Insufficient documentation

## 2012-10-15 DIAGNOSIS — Y929 Unspecified place or not applicable: Secondary | ICD-10-CM | POA: Insufficient documentation

## 2012-10-15 DIAGNOSIS — R791 Abnormal coagulation profile: Secondary | ICD-10-CM

## 2012-10-15 DIAGNOSIS — S0101XA Laceration without foreign body of scalp, initial encounter: Secondary | ICD-10-CM

## 2012-10-15 DIAGNOSIS — Y939 Activity, unspecified: Secondary | ICD-10-CM | POA: Insufficient documentation

## 2012-10-15 DIAGNOSIS — IMO0002 Reserved for concepts with insufficient information to code with codable children: Secondary | ICD-10-CM | POA: Insufficient documentation

## 2012-10-15 MED ORDER — TETANUS-DIPHTH-ACELL PERTUSSIS 5-2.5-18.5 LF-MCG/0.5 IM SUSP
0.5000 mL | Freq: Once | INTRAMUSCULAR | Status: AC
  Administered 2012-10-15: 0.5 mL via INTRAMUSCULAR
  Filled 2012-10-15: qty 0.5

## 2012-10-15 MED ORDER — LIDOCAINE-EPINEPHRINE-TETRACAINE (LET) SOLUTION
3.0000 mL | Freq: Once | NASAL | Status: AC
  Administered 2012-10-15: 3 mL via TOPICAL
  Filled 2012-10-15: qty 3
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
## 2012-10-15 NOTE — ED Notes (Signed)
Pt alert & oriented x4, stable gait. Patient given discharge instructions, paperwork & prescription(s). Patient  instructed to stop at the registration desk to finish any additional paperwork. Patient verbalized understanding. Pt left department w/ no further questions. 
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
## 2012-10-16 ENCOUNTER — Ambulatory Visit (INDEPENDENT_AMBULATORY_CARE_PROVIDER_SITE_OTHER): Payer: BC Managed Care – PPO | Admitting: *Deleted

## 2012-10-16 DIAGNOSIS — I4891 Unspecified atrial fibrillation: Secondary | ICD-10-CM

## 2012-10-16 LAB — POCT INR: INR: 1.7

## 2012-10-30 ENCOUNTER — Ambulatory Visit (INDEPENDENT_AMBULATORY_CARE_PROVIDER_SITE_OTHER): Payer: BC Managed Care – PPO | Admitting: *Deleted

## 2012-10-30 DIAGNOSIS — I4891 Unspecified atrial fibrillation: Secondary | ICD-10-CM

## 2012-10-30 LAB — POCT INR: INR: 2.3

## 2012-11-14 ENCOUNTER — Other Ambulatory Visit: Payer: Self-pay | Admitting: Cardiology

## 2012-11-27 ENCOUNTER — Ambulatory Visit (INDEPENDENT_AMBULATORY_CARE_PROVIDER_SITE_OTHER): Payer: BC Managed Care – PPO | Admitting: *Deleted

## 2012-11-27 DIAGNOSIS — I4891 Unspecified atrial fibrillation: Secondary | ICD-10-CM

## 2012-12-20 ENCOUNTER — Ambulatory Visit (INDEPENDENT_AMBULATORY_CARE_PROVIDER_SITE_OTHER): Payer: BC Managed Care – PPO | Admitting: *Deleted

## 2012-12-20 DIAGNOSIS — I4891 Unspecified atrial fibrillation: Secondary | ICD-10-CM

## 2013-01-10 ENCOUNTER — Ambulatory Visit (INDEPENDENT_AMBULATORY_CARE_PROVIDER_SITE_OTHER): Payer: BC Managed Care – PPO | Admitting: *Deleted

## 2013-01-10 DIAGNOSIS — I4891 Unspecified atrial fibrillation: Secondary | ICD-10-CM

## 2013-02-07 ENCOUNTER — Ambulatory Visit (INDEPENDENT_AMBULATORY_CARE_PROVIDER_SITE_OTHER): Payer: BC Managed Care – PPO | Admitting: *Deleted

## 2013-02-07 DIAGNOSIS — I4891 Unspecified atrial fibrillation: Secondary | ICD-10-CM

## 2013-02-28 ENCOUNTER — Encounter: Payer: Self-pay | Admitting: *Deleted

## 2013-03-08 ENCOUNTER — Ambulatory Visit (INDEPENDENT_AMBULATORY_CARE_PROVIDER_SITE_OTHER): Payer: BC Managed Care – PPO | Admitting: *Deleted

## 2013-03-08 DIAGNOSIS — I4891 Unspecified atrial fibrillation: Secondary | ICD-10-CM

## 2013-03-15 ENCOUNTER — Other Ambulatory Visit: Payer: Self-pay | Admitting: Cardiology

## 2013-04-05 ENCOUNTER — Ambulatory Visit (INDEPENDENT_AMBULATORY_CARE_PROVIDER_SITE_OTHER): Payer: BC Managed Care – PPO | Admitting: *Deleted

## 2013-04-05 DIAGNOSIS — I4891 Unspecified atrial fibrillation: Secondary | ICD-10-CM

## 2013-04-05 LAB — POCT INR: INR: 2.2

## 2013-04-15 ENCOUNTER — Other Ambulatory Visit: Payer: Self-pay | Admitting: Cardiology

## 2013-04-16 ENCOUNTER — Other Ambulatory Visit: Payer: Self-pay | Admitting: Cardiology

## 2013-05-11 ENCOUNTER — Ambulatory Visit (INDEPENDENT_AMBULATORY_CARE_PROVIDER_SITE_OTHER): Payer: BC Managed Care – PPO | Admitting: *Deleted

## 2013-05-11 ENCOUNTER — Ambulatory Visit (INDEPENDENT_AMBULATORY_CARE_PROVIDER_SITE_OTHER): Payer: BC Managed Care – PPO | Admitting: Cardiology

## 2013-05-11 VITALS — BP 142/95 | HR 75 | Ht 71.0 in | Wt 356.0 lb

## 2013-05-11 DIAGNOSIS — I10 Essential (primary) hypertension: Secondary | ICD-10-CM

## 2013-05-11 DIAGNOSIS — I4891 Unspecified atrial fibrillation: Secondary | ICD-10-CM

## 2013-05-11 DIAGNOSIS — Z5181 Encounter for therapeutic drug level monitoring: Secondary | ICD-10-CM | POA: Insufficient documentation

## 2013-05-11 DIAGNOSIS — E782 Mixed hyperlipidemia: Secondary | ICD-10-CM

## 2013-05-11 LAB — POCT INR: INR: 2.1

## 2013-05-11 NOTE — Progress Notes (Signed)
Clinical Summary Mr. Delford FieldWright is a 63 y.o.male former patient of Dr Dietrich Patesothbart, this is our first visit together. She is seen today for follow up of the following medical problems.  1. Afib - no recent palpitations - compliant with coumadin, can have some occasoinal easy bruising. Followed here in coumadin clinic.   2. HTN - checks bp occasionally at home - admits he is not limiting sodium intkae  3. Hyperlipidemia - compliant with lipitor - last panel 05/2012: 137 TG 74 HDL 37 LDL 85  4. Tobacco - 1/2 ppd - precontemplative, not ready to quit. Lots of social stress ongoing now.   5. OSA? - no prior diagnosis. + history of snoring, denies daytime somnolence of witnessed apneic episodes   Past Medical History  Diagnosis Date  . Hypertension   . Hyperlipidemia   . Atrial fibrillation     unknown onset  . Chronic anticoagulation   . Tobacco abuse   . Degenerative joint disease     Left TKA; right shoulder surgery also advised  . Obesity      Allergies  Allergen Reactions  . Iodinated Diagnostic Agents      Current Outpatient Prescriptions  Medication Sig Dispense Refill  . atorvastatin (LIPITOR) 40 MG tablet take 1 tablet once daily  30 tablet  0  . diltiazem (CARDIZEM CD) 240 MG 24 hr capsule take 1 capsule twice a day  60 capsule  3  . lisinopril-hydrochlorothiazide (PRINZIDE,ZESTORETIC) 20-12.5 MG per tablet Take 2 tablets by mouth daily.      [name]. warfarin (COUMADIN) 10 MG tablet TAKE 1 TABLET DAILY EXCEPT 1 AND 1/2 TABLETS ON MONDAYS OR AS DIRECTED.  45 tablet  0   No current facility-administered medications for this visit.     Past Surgical History  Procedure Laterality Date  . Total knee arthroplasty      Left     Allergies  Allergen Reactions  . Iodinated Diagnostic Agents       Family History  Problem Relation Age of Onset  . Hypertension Mother   . Alzheimer's disease Father      Social History Mr. Delford FieldWright reports that he has been  smoking.  He has never used smokeless tobacco. Mr. Delford FieldWright reports that he does not drink alcohol.   Review of Systems CONSTITUTIONAL: No weight loss, fever, chills, weakness or fatigue.  HEENT: Eyes: No visual loss, blurred vision, double vision or yellow sclerae.No hearing loss, sneezing, congestion, runny nose or sore throat.  SKIN: No rash or itching.  CARDIOVASCULAR: per HPI RESPIRATORY: No shortness of breath, cough or sputum.  GASTROINTESTINAL: No anorexia, nausea, vomiting or diarrhea. No abdominal pain or blood.  GENITOURINARY: No burning on urination, no polyuria NEUROLOGICAL: No headache, dizziness, syncope, paralysis, ataxia, numbness or tingling in the extremities. No change in bowel or bladder control.  MUSCULOSKELETAL: No muscle, back pain, joint pain or stiffness.  LYMPHATICS: No enlarged nodes. No history of splenectomy.  PSYCHIATRIC: No history of depression or anxiety.  ENDOCRINOLOGIC: No reports of sweating, cold or heat intolerance. No polyuria or polydipsia.  [name].   Physical Examination p 75 bp 130/80 Wt 356 lbs BMI 50 Gen: resting comfortably, no acute distress HEENT: no scleral icterus, pupils equal round and reactive, no palptable cervical adenopathy,  CV: RRR, no m/r/g, no JVD, no carotid bruits Resp: Clear to auscultation bilaterally GI: abdomen is soft, non-tender, non-distended, normal bowel sounds, no hepatosplenomegaly MSK: extremities are warm, no edema.  Skin: warm, no rash  Neuro:  no focal deficits Psych: appropriate affect   Diagnostic Studies  05/10/13 Clinic EKG: afib rate 78, isolated PVC   Assessment and Plan  1. Afib - no current symptoms - discussed new oral anticoags, he is happy with coumadin for the time being. Will continue  2. HTN - at goal, continue current meds - counseled on low sodium diet  3. Hyperlipidemia - repeat lipid panel, continue current statin  4. Tobacco - precontemplative, educated on risk of continued smoking  and benefits of cessatoin  5. OSA? - has multiple risk factors for OSA, he would like to hold off on sleep study for the time being. Readdress next visit  6. Severe obesity - discussed nutrition referral, he would like to defer for now. Counseled on benefits of weight loss and exercise.    Follow up 6 months   [name], M.D., F.A.C.C.

## 2013-05-11 NOTE — Patient Instructions (Addendum)
Your physician recommends that you schedule a follow-up appointment in: ONE MONTH WITH LISA REID FOR COUMADIN CHECK/Your physician wants you to follow-up in: 6 MONTHS WITH DR Beckett SpringsBRANCH You will receive a reminder letter in the mail two months in advance. If you don't receive a letter, please call our office to schedule the follow-up appointment.  Your physician recommends that you return for lab work in: NEXT WEEK, YOU WILL NEED TO BE FASTING, NOTHING TO EAT OR DRINK AFTER MIDNIGHT EXCEPT FOR SIPS OF WATER WITH YOUR MEDICATIONS (FLP,CMET,CBC)  Your INR today is 2.1    Vashti HeyLisa Reid RN Coumadin nurse requests that you continue same dose of Warfarin and make 1 month follow up apt

## 2013-05-14 NOTE — Addendum Note (Signed)
Addended by: Thompson GrayerURHAM, Cloy Cozzens C on: 05/14/2013 10:21 AM   Modules accepted: Orders

## 2013-05-17 LAB — COMPREHENSIVE METABOLIC PANEL
ALT: 16 U/L (ref 0–53)
AST: 20 U/L (ref 0–37)
Albumin: 3.6 g/dL (ref 3.5–5.2)
Alkaline Phosphatase: 97 U/L (ref 39–117)
BUN: 19 mg/dL (ref 6–23)
CALCIUM: 8.8 mg/dL (ref 8.4–10.5)
CHLORIDE: 106 meq/L (ref 96–112)
CO2: 27 mEq/L (ref 19–32)
CREATININE: 0.94 mg/dL (ref 0.50–1.35)
Glucose, Bld: 105 mg/dL — ABNORMAL HIGH (ref 70–99)
POTASSIUM: 3.9 meq/L (ref 3.5–5.3)
Sodium: 141 mEq/L (ref 135–145)
Total Bilirubin: 0.7 mg/dL (ref 0.2–1.2)
Total Protein: 6.2 g/dL (ref 6.0–8.3)

## 2013-05-17 LAB — LIPID PANEL
Cholesterol: 129 mg/dL (ref 0–200)
HDL: 34 mg/dL — ABNORMAL LOW (ref >39)
LDL CALC: 82 mg/dL (ref 0–99)
TRIGLYCERIDES: 65 mg/dL (ref <150)
Total CHOL/HDL Ratio: 3.8 Ratio
VLDL: 13 mg/dL (ref 0–40)

## 2013-05-17 LAB — CBC
HEMATOCRIT: 43.6 % (ref 39.0–52.0)
Hemoglobin: 15 g/dL (ref 13.0–17.0)
MCH: 29.4 pg (ref 26.0–34.0)
MCHC: 34.4 g/dL (ref 30.0–36.0)
MCV: 85.3 fL (ref 78.0–100.0)
Platelets: 247 10*3/uL (ref 150–400)
RBC: 5.11 MIL/uL (ref 4.22–5.81)
RDW: 14.8 % (ref 11.5–15.5)
WBC: 10.2 10*3/uL (ref 4.0–10.5)

## 2013-05-22 ENCOUNTER — Telehealth: Payer: Self-pay | Admitting: Adult Health

## 2013-05-22 MED ORDER — ATORVASTATIN CALCIUM 40 MG PO TABS: 40.0000 mg | ORAL_TABLET | Freq: Every day | ORAL | Status: DC

## 2013-05-22 NOTE — Telephone Encounter (Signed)
Received fax refill request  Rx # [id number] Medication:  Atorvastatin 40 mg tablet Qty 30 Sig:  Take one tablet by mouth once daily Physician:  Lyman BishopLawrence

## 2013-05-22 NOTE — Telephone Encounter (Signed)
Refill done per pt request.  

## 2013-05-28 ENCOUNTER — Telehealth: Payer: Self-pay | Admitting: Adult Health

## 2013-05-28 MED ORDER — DILTIAZEM HCL ER COATED BEADS 240 MG PO CP24: ORAL_CAPSULE | ORAL | Status: DC

## 2013-05-28 NOTE — Telephone Encounter (Signed)
[facility] [place] [facility] [place]gs

## 2013-05-28 NOTE — Telephone Encounter (Signed)
Please see refill bin for Prior Authorization request / tgs

## 2013-05-28 NOTE — Telephone Encounter (Signed)
Medication sent via escribe.  

## 2013-05-28 NOTE — Telephone Encounter (Signed)
Pt need this today and he needs #60 filled. They have been calling it in for 30 days but that is not enough for the month.

## 2013-05-29 NOTE — Telephone Encounter (Signed)
Noted form completed for pt per refill was sent yesterday however pt insurance company denied the dosage for #60 however pt has been taking for over 3 years twice daily, Dr Wyline MoodBranch completed and form fa

## 2013-05-29 NOTE — Telephone Encounter (Signed)
Continued, pt made aware via YL that the form had been completed, pt called back to advise that he leaves for out of town tomorrow and wants to ask what Dr Wyline MoodBranch advises per pt is out and will be out of town until Sunday, Dr Wyline MoodBranch aware verbally and noted we can send pt the #30 however pt may have to contest the amount once he gets back per pt accepts the amount of #30, pt understood and requested time to contact his insurance to see if he can assess the issue from his end, returned pt call to get and update and was advised he got the prescription approved and he has picked it up the #60 and will call back to our office with any further concerns if noted

## 2013-05-31 ENCOUNTER — Encounter: Payer: Self-pay | Admitting: *Deleted

## 2013-06-11 ENCOUNTER — Telehealth: Payer: Self-pay | Admitting: Adult Health

## 2013-06-11 MED ORDER — WARFARIN SODIUM 10 MG PO TABS: ORAL_TABLET | ORAL | Status: DC

## 2013-06-11 NOTE — Telephone Encounter (Signed)
Received fax refill request  Rx # [id number] Medication:  Warfarin Sodium 10 mg tablet Qty 45 Sig:  Take one tablet daily except one and one half tablets on Monday Physician:  Lyman BishopLawrence

## 2013-06-13 ENCOUNTER — Other Ambulatory Visit: Payer: Self-pay | Admitting: Cardiology

## 2013-06-20 ENCOUNTER — Ambulatory Visit (INDEPENDENT_AMBULATORY_CARE_PROVIDER_SITE_OTHER): Payer: BC Managed Care – PPO | Admitting: *Deleted

## 2013-06-20 DIAGNOSIS — Z5181 Encounter for therapeutic drug level monitoring: Secondary | ICD-10-CM

## 2013-06-20 DIAGNOSIS — I4891 Unspecified atrial fibrillation: Secondary | ICD-10-CM

## 2013-06-20 LAB — POCT INR: INR: 2.5

## 2013-08-01 ENCOUNTER — Ambulatory Visit (INDEPENDENT_AMBULATORY_CARE_PROVIDER_SITE_OTHER): Payer: BC Managed Care – PPO | Admitting: *Deleted

## 2013-08-01 DIAGNOSIS — I4891 Unspecified atrial fibrillation: Secondary | ICD-10-CM

## 2013-08-01 DIAGNOSIS — Z5181 Encounter for therapeutic drug level monitoring: Secondary | ICD-10-CM

## 2013-08-01 LAB — POCT INR: INR: 3.3

## 2013-09-12 ENCOUNTER — Ambulatory Visit (INDEPENDENT_AMBULATORY_CARE_PROVIDER_SITE_OTHER): Payer: BC Managed Care – PPO | Admitting: *Deleted

## 2013-09-12 DIAGNOSIS — I4891 Unspecified atrial fibrillation: Secondary | ICD-10-CM

## 2013-09-12 DIAGNOSIS — Z5181 Encounter for therapeutic drug level monitoring: Secondary | ICD-10-CM

## 2013-09-12 LAB — POCT INR: INR: 4.3

## 2013-09-27 ENCOUNTER — Ambulatory Visit (INDEPENDENT_AMBULATORY_CARE_PROVIDER_SITE_OTHER): Payer: BC Managed Care – PPO | Admitting: *Deleted

## 2013-09-27 DIAGNOSIS — Z5181 Encounter for therapeutic drug level monitoring: Secondary | ICD-10-CM

## 2013-09-27 DIAGNOSIS — I4891 Unspecified atrial fibrillation: Secondary | ICD-10-CM

## 2013-09-27 LAB — POCT INR: INR: 3

## 2013-10-25 ENCOUNTER — Ambulatory Visit (INDEPENDENT_AMBULATORY_CARE_PROVIDER_SITE_OTHER): Payer: BC Managed Care – PPO | Admitting: *Deleted

## 2013-10-25 DIAGNOSIS — Z5181 Encounter for therapeutic drug level monitoring: Secondary | ICD-10-CM

## 2013-10-25 DIAGNOSIS — I4891 Unspecified atrial fibrillation: Secondary | ICD-10-CM

## 2013-10-25 LAB — POCT INR: INR: 2.9

## 2013-11-22 ENCOUNTER — Ambulatory Visit (INDEPENDENT_AMBULATORY_CARE_PROVIDER_SITE_OTHER): Payer: BC Managed Care – PPO | Admitting: *Deleted

## 2013-11-22 DIAGNOSIS — Z5181 Encounter for therapeutic drug level monitoring: Secondary | ICD-10-CM

## 2013-11-22 DIAGNOSIS — I4891 Unspecified atrial fibrillation: Secondary | ICD-10-CM

## 2013-11-22 LAB — POCT INR: INR: 2.2

## 2013-12-10 ENCOUNTER — Telehealth: Payer: Self-pay | Admitting: Cardiology

## 2013-12-10 MED ORDER — ATORVASTATIN CALCIUM 40 MG PO TABS: 40.0000 mg | ORAL_TABLET | Freq: Every day | ORAL | Status: DC

## 2013-12-10 NOTE — Telephone Encounter (Signed)
Received fax refill request  Rx # [id number] Medication:  Atorvastatin 40 mg tablet Qty 30 Sig:  Take one tablet once daily Physician:  Wyline Mood

## 2013-12-17 ENCOUNTER — Telehealth: Payer: Self-pay | Admitting: Cardiology

## 2013-12-17 MED ORDER — DILTIAZEM HCL ER COATED BEADS 240 MG PO CP24: ORAL_CAPSULE | ORAL | Status: DC

## 2013-12-17 NOTE — Telephone Encounter (Signed)
refill request complete 

## 2013-12-17 NOTE — Telephone Encounter (Signed)
Received fax refill request  Rx # [id number] Medication:  Cartia XT 240 mg capsule Qty 60 Sig:  Take one capsule by mouth twice daily Physician:  Wyline Mood

## 2013-12-27 ENCOUNTER — Ambulatory Visit (INDEPENDENT_AMBULATORY_CARE_PROVIDER_SITE_OTHER): Payer: BC Managed Care – PPO | Admitting: *Deleted

## 2013-12-27 ENCOUNTER — Encounter: Payer: Self-pay | Admitting: Cardiology

## 2013-12-27 ENCOUNTER — Ambulatory Visit (INDEPENDENT_AMBULATORY_CARE_PROVIDER_SITE_OTHER): Payer: BC Managed Care – PPO | Admitting: Cardiology

## 2013-12-27 VITALS — BP 146/100 | HR 75 | Ht 69.0 in | Wt 356.0 lb

## 2013-12-27 DIAGNOSIS — I10 Essential (primary) hypertension: Secondary | ICD-10-CM

## 2013-12-27 DIAGNOSIS — I4891 Unspecified atrial fibrillation: Secondary | ICD-10-CM

## 2013-12-27 DIAGNOSIS — Z5181 Encounter for therapeutic drug level monitoring: Secondary | ICD-10-CM

## 2013-12-27 DIAGNOSIS — Z720 Tobacco use: Secondary | ICD-10-CM

## 2013-12-27 DIAGNOSIS — E782 Mixed hyperlipidemia: Secondary | ICD-10-CM

## 2013-12-27 DIAGNOSIS — F17200 Nicotine dependence, unspecified, uncomplicated: Secondary | ICD-10-CM

## 2013-12-27 LAB — POCT INR: INR: 2.6

## 2013-12-27 MED ORDER — ATORVASTATIN CALCIUM 40 MG PO TABS: 40.0000 mg | ORAL_TABLET | Freq: Every day | ORAL | Status: DC

## 2013-12-27 MED ORDER — DILTIAZEM HCL ER COATED BEADS 240 MG PO CP24: ORAL_CAPSULE | ORAL | Status: DC

## 2013-12-27 MED ORDER — LISINOPRIL-HYDROCHLOROTHIAZIDE 20-12.5 MG PO TABS: 2.0000 | ORAL_TABLET | Freq: Every day | ORAL | Status: DC

## 2013-12-27 NOTE — Progress Notes (Signed)
Clinical Summary Tony [name] is a 63 y.o.male seen today for follow up of the following medical problems.   1. Afib  - no recent palpitations  - compliant with coumadin, denies any bleeding issues  2. HTN  - checks occasionally at home, typically 140s/80s - admits he is not limiting sodium intake  3. Hyperlipidemia  - compliant with lipitor  - last panel Jan 2015: TC 129 TG 65 HDL 34 LDL 82  4. Tobacco  - 1/2 ppd.   - precontemplative, not ready to quit.   5. OSA?  - no prior diagnosis. + history of snoring, denies daytime somnolence of witnessed apneic episodes   Past Medical History  Diagnosis Date  . Hypertension   . Hyperlipidemia   . Atrial fibrillation     unknown onset  . Chronic anticoagulation   . Tobacco abuse   . Degenerative joint disease     Left TKA; right shoulder surgery also advised  . Obesity      Allergies  Allergen Reactions  . Iodinated Diagnostic Agents      Current Outpatient Prescriptions  Medication Sig Dispense Refill  . atorvastatin (LIPITOR) 40 MG tablet Take 1 tablet (40 mg total) by mouth daily.  15 tablet  0  . diltiazem (CARDIZEM CD) 240 MG 24 hr capsule Take 1 capsule by mouth twice a day  60 capsule  6  . lisinopril-hydrochlorothiazide (PRINZIDE,ZESTORETIC) 20-12.5 MG per tablet take 2 tablets once daily  60 tablet  6  . warfarin (COUMADIN) 10 MG tablet Take 1 tablet daily except 1 1/2 tablets on Mondays or as directed  45 tablet  3   No current facility-administered medications for this visit.     Past Surgical History  Procedure Laterality Date  . Total knee arthroplasty      Left     Allergies  Allergen Reactions  . Iodinated Diagnostic Agents       Family History  Problem Relation Age of Onset  . Hypertension Mother   . Alzheimer's disease Father      Social History Tony [name] reports that he has been smoking Cigarettes.  He has a 15 pack-year smoking history. He has never used smokeless  tobacco. Tony [name] reports that he does not drink alcohol.   Review of Systems CONSTITUTIONAL: No weight loss, fever, chills, weakness or fatigue.  HEENT: Eyes: No visual loss, blurred vision, double vision or yellow sclerae.No hearing loss, sneezing, congestion, runny nose or sore throat.  SKIN: No rash or itching.  CARDIOVASCULAR: per HPI RESPIRATORY: No shortness of breath, cough or sputum.  GASTROINTESTINAL: No anorexia, nausea, vomiting or diarrhea. No abdominal pain or blood.  GENITOURINARY: No burning on urination, no polyuria NEUROLOGICAL: No headache, dizziness, syncope, paralysis, ataxia, numbness or tingling in the extremities. No change in bowel or bladder control.  MUSCULOSKELETAL: No muscle, back pain, joint pain or stiffness.  LYMPHATICS: No enlarged nodes. No history of splenectomy.  PSYCHIATRIC: No history of depression or anxiety.  ENDOCRINOLOGIC: No reports of sweating, cold or heat intolerance. No polyuria or polydipsia.  [name]   Physical Examination There were no vitals filed for this visit. Filed Weights   12/27/13 1538  Weight: 356 lb (161.481 kg)    Gen: resting comfortably, no acute distress HEENT: no scleral icterus, pupils equal round and reactive, no palptable cervical adenopathy,  CV: RRR, no m/r/g, no JVD, no carotid bruits Resp: Clear to auscultation bilaterally GI: abdomen is soft, non-tender, non-distended, normal bowel sounds,  no hepatosplenomegaly MSK: extremities are warm, no edema.  Skin: warm, no rash Neuro:  no focal deficits Psych: appropriate affect     Assessment and Plan  1. Afib  - no current symptoms  - discussed new oral anticoags again today, he is happy with coumadin for the time being. Will continue   2. HTN  - at goal, continue current meds  - counseled on low sodium diet   3. Hyperlipidemia  - continue current statin, at goal  4. Tobacco  - precontemplative, educated on risk of continued smoking and benefits of  cessatoin  - he is going to try e-cigs  5. OSA?  - has multiple risk factors for OSA, he would like to hold off on sleep study for the time being. Continue to readdress at each visit         [name], M.D., F.A.C.C.

## 2013-12-27 NOTE — Patient Instructions (Signed)
Your physician wants you to follow-up in: 6 months You will receive a reminder letter in the mail two months in advance. If you don't receive a letter, please call our office to schedule the follow-up appointment.     Your physician recommends that you continue on your current medications as directed. Please refer to the Current Medication list given to you today.      Thank you for choosing [facility] !        

## 2014-02-06 ENCOUNTER — Ambulatory Visit (INDEPENDENT_AMBULATORY_CARE_PROVIDER_SITE_OTHER): Payer: BC Managed Care – PPO | Admitting: *Deleted

## 2014-02-06 DIAGNOSIS — I4891 Unspecified atrial fibrillation: Secondary | ICD-10-CM

## 2014-02-06 DIAGNOSIS — Z5181 Encounter for therapeutic drug level monitoring: Secondary | ICD-10-CM

## 2014-02-06 LAB — POCT INR: INR: 2.7

## 2014-03-20 ENCOUNTER — Ambulatory Visit (INDEPENDENT_AMBULATORY_CARE_PROVIDER_SITE_OTHER): Payer: BC Managed Care – PPO | Admitting: *Deleted

## 2014-03-20 DIAGNOSIS — Z5181 Encounter for therapeutic drug level monitoring: Secondary | ICD-10-CM

## 2014-03-20 DIAGNOSIS — I4891 Unspecified atrial fibrillation: Secondary | ICD-10-CM

## 2014-03-20 LAB — POCT INR: INR: 2.1

## 2014-05-01 ENCOUNTER — Ambulatory Visit (INDEPENDENT_AMBULATORY_CARE_PROVIDER_SITE_OTHER): Payer: BLUE CROSS/BLUE SHIELD | Admitting: *Deleted

## 2014-05-01 DIAGNOSIS — I4891 Unspecified atrial fibrillation: Secondary | ICD-10-CM

## 2014-05-01 DIAGNOSIS — Z5181 Encounter for therapeutic drug level monitoring: Secondary | ICD-10-CM

## 2014-05-01 LAB — POCT INR: INR: 3.4

## 2014-05-29 ENCOUNTER — Ambulatory Visit (INDEPENDENT_AMBULATORY_CARE_PROVIDER_SITE_OTHER): Payer: BLUE CROSS/BLUE SHIELD | Admitting: *Deleted

## 2014-05-29 DIAGNOSIS — Z5181 Encounter for therapeutic drug level monitoring: Secondary | ICD-10-CM

## 2014-05-29 DIAGNOSIS — I4891 Unspecified atrial fibrillation: Secondary | ICD-10-CM

## 2014-05-29 LAB — POCT INR: INR: 2.2

## 2014-06-03 IMAGING — CT CT HEAD W/O CM
1 series · 16 of 30 positions shown, 20 images · non-contrast
Comparison: None.

CLINICAL DATA: hit head, laceration of foreheadright frontal scalp
contusion.  No acute intracranial process.

CT HEAD WITHOUT CONTRAST
TECHNIQUE: Contiguous axial images were obtained from the base of
the skull through the vertex without contrast.

[Series 2: headtrauma 4.8 h37s · axial · 0.54mm/px · z∈[+57,+220]mm · 16 of 36 slices shown, 20 images]
[im 2/36  brain]
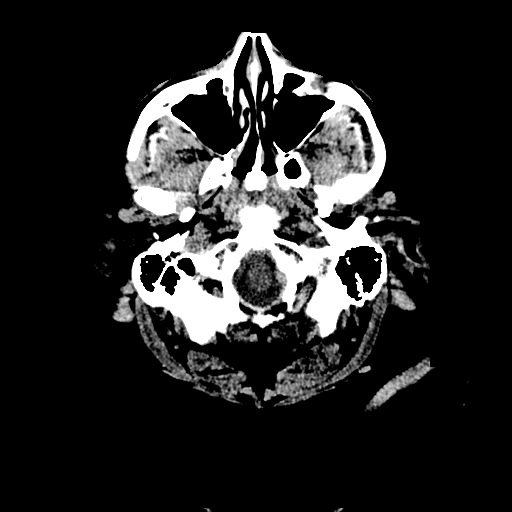
[im 2/36  bone]
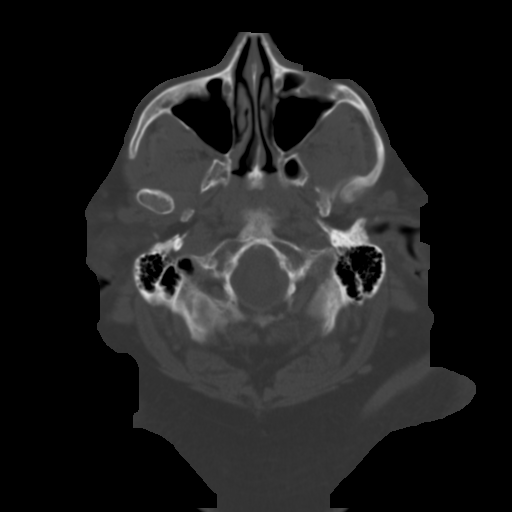
[im 4/36  brain]
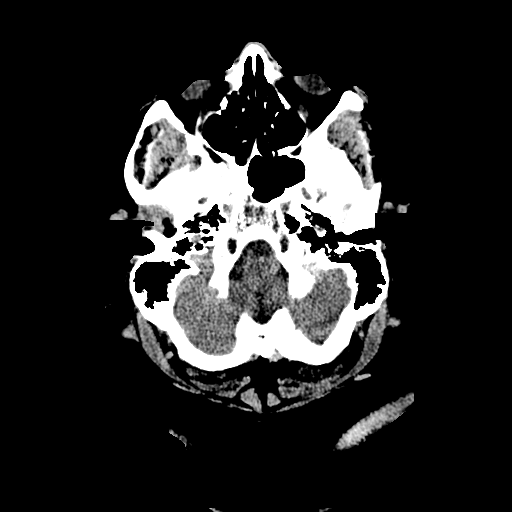
[im 7/36  brain]
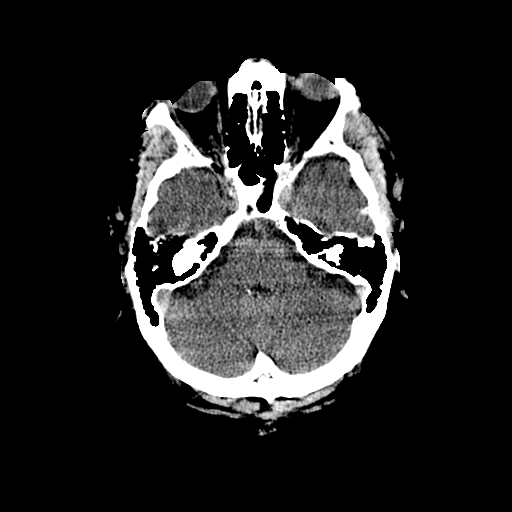
[im 9/36  brain]
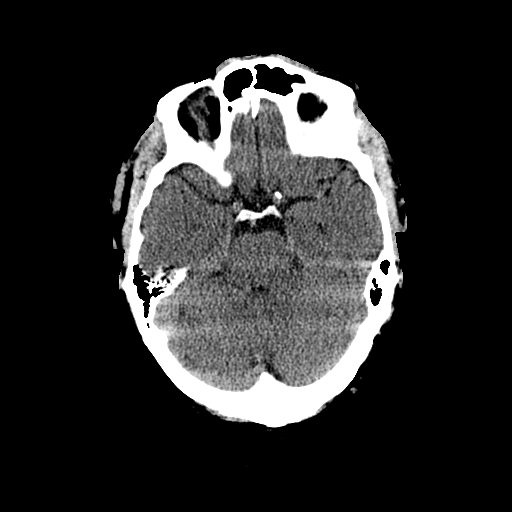
[im 10/36  brain]
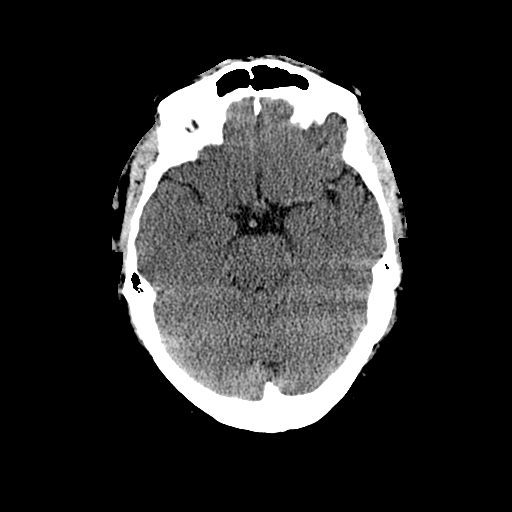
[im 10/36  bone]
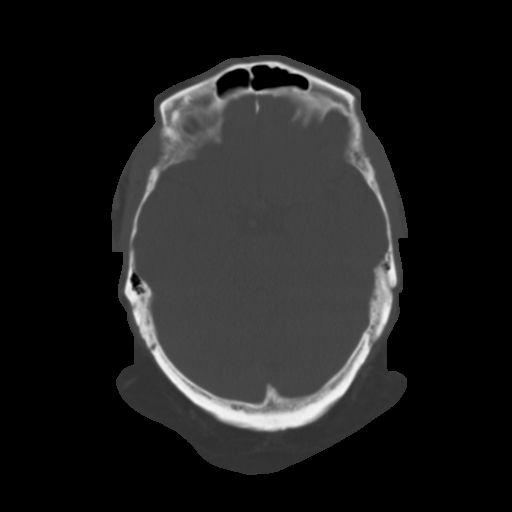
[im 13/36  brain]
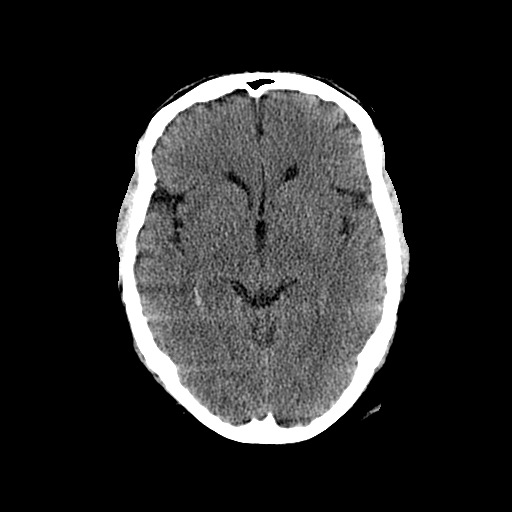
[im 15/36  brain]
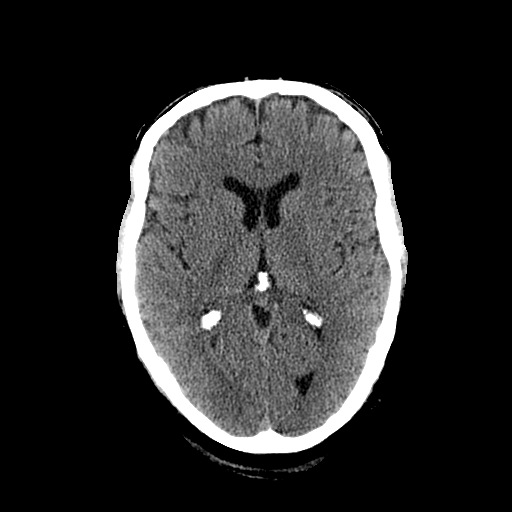
[im 17/36  brain]
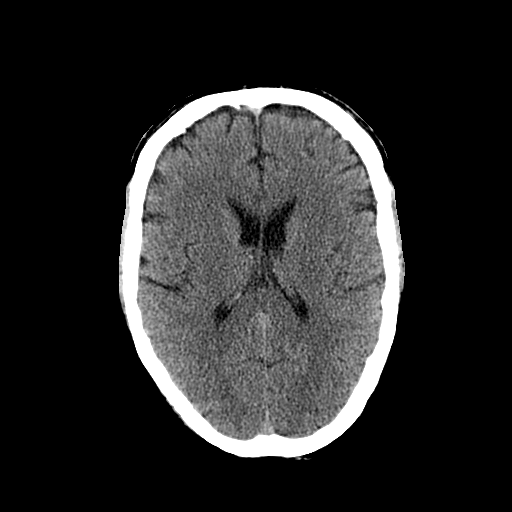
[im 19/36  brain]
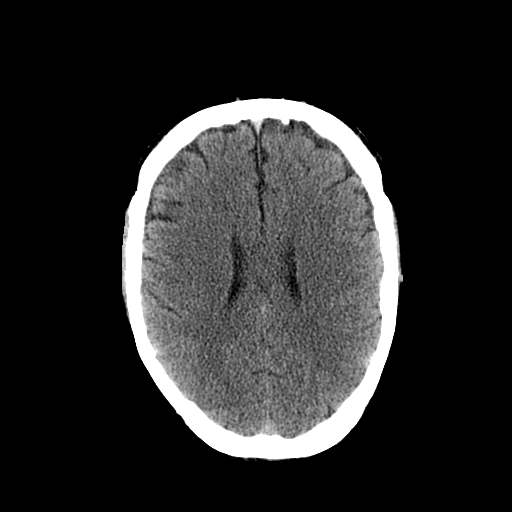
[im 19/36  bone]
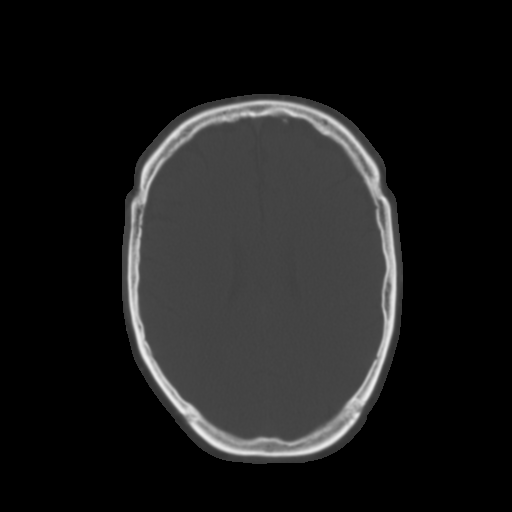
[im 21/36  brain]
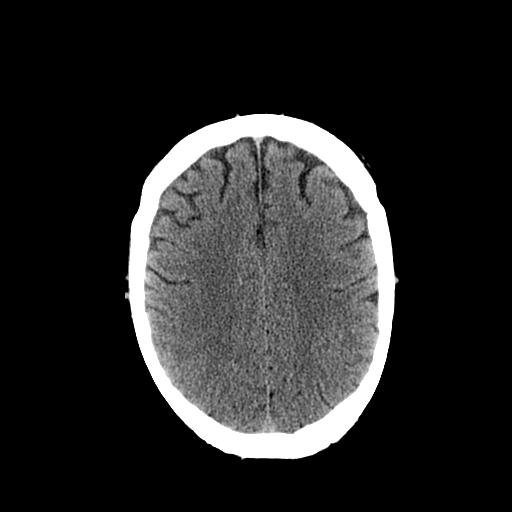
[im 23/36  brain]
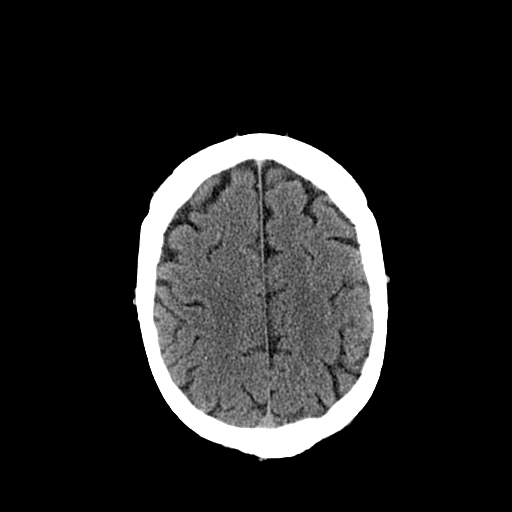
[im 26/36  brain]
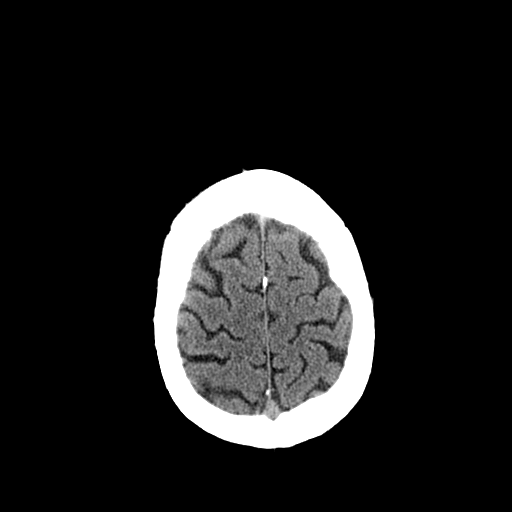
[im 27/36  brain]
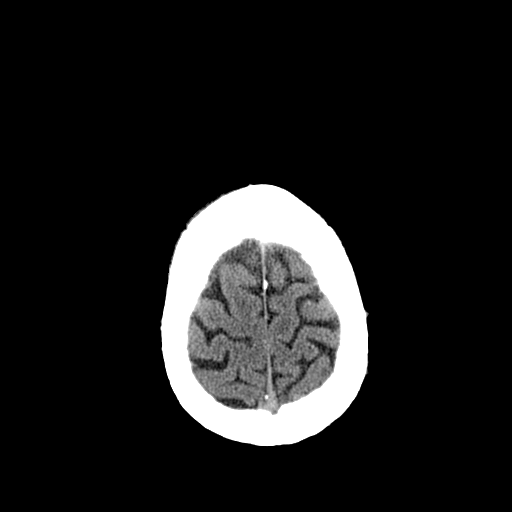
[im 27/36  bone]
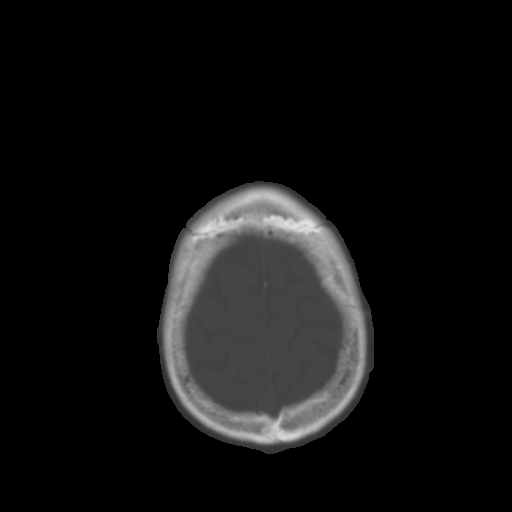
[im 29/36  brain]
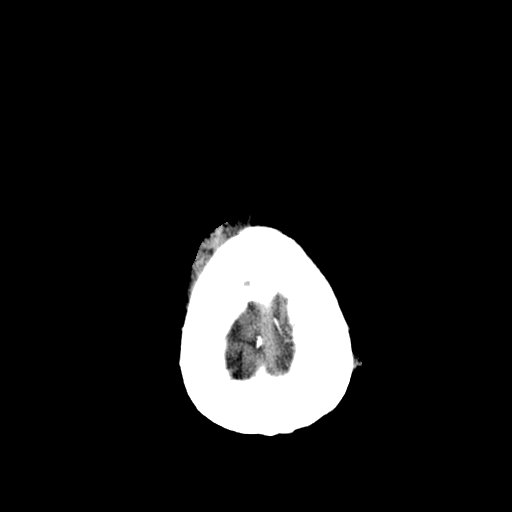
[im 32/36  brain]
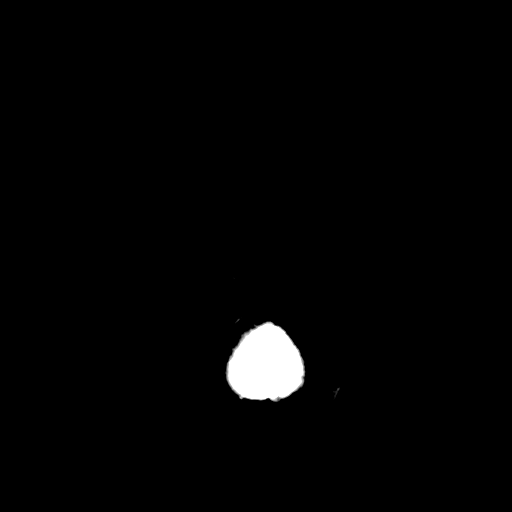
[im 34/36  brain]
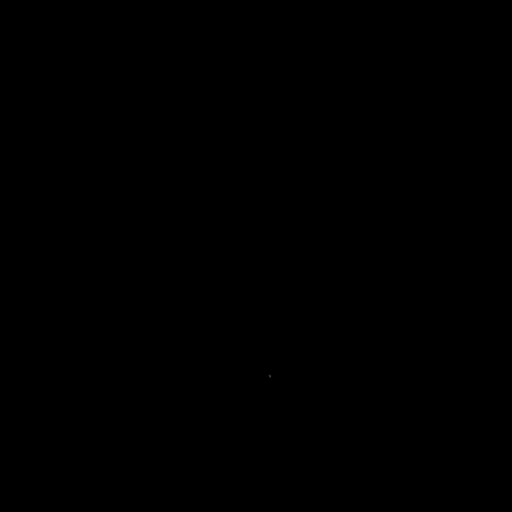

[16 of 30 positions shown; findings below may reference images not displayed]

FINDINGS: Small right scalp contusion is noted. There is no acute
intracranial hemorrhage or infarct.  No midline shift or mass
lesion.  CSF containing spaces are normal. There are no extra-axial
fluid collections.  Calvarium is intact.  Minimal opacity is
present within the right sphenoid sinus.  Mastoid air cells well
pneumatized.
IMPRESSION: Right frontal scalp contusion. No acute intracranial process

## 2014-07-01 ENCOUNTER — Ambulatory Visit (INDEPENDENT_AMBULATORY_CARE_PROVIDER_SITE_OTHER): Payer: BLUE CROSS/BLUE SHIELD | Admitting: *Deleted

## 2014-07-01 DIAGNOSIS — Z5181 Encounter for therapeutic drug level monitoring: Secondary | ICD-10-CM

## 2014-07-01 DIAGNOSIS — I4891 Unspecified atrial fibrillation: Secondary | ICD-10-CM

## 2014-07-01 LAB — POCT INR: INR: 3.1

## 2014-07-01 MED ORDER — WARFARIN SODIUM 10 MG PO TABS: ORAL_TABLET | ORAL | Status: DC

## 2014-07-01 MED ORDER — DILTIAZEM HCL ER COATED BEADS 240 MG PO CP24: ORAL_CAPSULE | ORAL | Status: DC

## 2014-07-01 MED ORDER — LISINOPRIL-HYDROCHLOROTHIAZIDE 20-12.5 MG PO TABS: 2.0000 | ORAL_TABLET | Freq: Every day | ORAL | Status: DC

## 2014-07-01 MED ORDER — ATORVASTATIN CALCIUM 40 MG PO TABS: 40.0000 mg | ORAL_TABLET | Freq: Every day | ORAL | Status: DC

## 2014-07-23 ENCOUNTER — Other Ambulatory Visit: Payer: Self-pay | Admitting: Cardiology

## 2014-07-23 MED ORDER — ATORVASTATIN CALCIUM 40 MG PO TABS: 40.0000 mg | ORAL_TABLET | Freq: Every day | ORAL | Status: DC

## 2014-07-23 NOTE — Telephone Encounter (Signed)
Received fax refill request  Rx # [id number] Medication:  Atorvastatin 40 mg tablet Qty 30 Sig: Take one tablet once daily Physician:  Wyline MoodBranch

## 2014-07-23 NOTE — Telephone Encounter (Signed)
Refill complete 

## 2014-08-02 ENCOUNTER — Other Ambulatory Visit: Payer: Self-pay | Admitting: Cardiology

## 2014-08-02 MED ORDER — LISINOPRIL-HYDROCHLOROTHIAZIDE 20-12.5 MG PO TABS: 2.0000 | ORAL_TABLET | Freq: Every day | ORAL | Status: DC

## 2014-08-02 NOTE — Telephone Encounter (Signed)
Received fax refill request  Rx # [id number] Medication:  Lisinopril - HCTZ 20-12.5 mg tab Qty 60 Sig:  Take 2 tablets once daily Physician:  Wyline MoodBranch

## 2014-08-07 ENCOUNTER — Encounter: Payer: Self-pay | Admitting: Cardiology

## 2014-08-07 ENCOUNTER — Ambulatory Visit (INDEPENDENT_AMBULATORY_CARE_PROVIDER_SITE_OTHER): Payer: BLUE CROSS/BLUE SHIELD | Admitting: *Deleted

## 2014-08-07 ENCOUNTER — Ambulatory Visit (INDEPENDENT_AMBULATORY_CARE_PROVIDER_SITE_OTHER): Payer: BLUE CROSS/BLUE SHIELD | Admitting: Cardiology

## 2014-08-07 VITALS — BP 118/82 | HR 71 | Ht 69.0 in | Wt 355.0 lb

## 2014-08-07 DIAGNOSIS — I4891 Unspecified atrial fibrillation: Secondary | ICD-10-CM

## 2014-08-07 DIAGNOSIS — Z5181 Encounter for therapeutic drug level monitoring: Secondary | ICD-10-CM | POA: Diagnosis not present

## 2014-08-07 LAB — POCT INR: INR: 1.7

## 2014-08-07 NOTE — Progress Notes (Signed)
Clinical Summary [name] is a 64 y.o.male seen today for follow up of the following medical problems.   1. Afib  - no recent palpitations  - compliant with coumadin, denies any bleeding issues  2. HTN  - checks occasionally at home, typically 120s/80s - compliant with meds  3. Hyperlipidemia  - compliant with lipitor  - last panel Jan 2015: TC 129 TG 65 HDL 34 LDL 82 - upcoming labs in June with pcp  4. Tobacco  - 1/2 ppd.  - precontemplative, not ready to quit.    Past Medical History  Diagnosis Date  . Hypertension   . Hyperlipidemia   . Atrial fibrillation     unknown onset  . Chronic anticoagulation   . Tobacco abuse   . Degenerative joint disease     Left TKA; right shoulder surgery also advised  . Obesity      Allergies  Allergen Reactions  . Iodinated Diagnostic Agents      Current Outpatient Prescriptions  Medication Sig Dispense Refill  . atorvastatin (LIPITOR) 40 MG tablet Take 1 tablet (40 mg total) by mouth daily. 30 tablet 6  . diltiazem (CARDIZEM CD) 240 MG 24 hr capsule Take 1 capsule by mouth twice a day 60 capsule 6  . lisinopril-hydrochlorothiazide (PRINZIDE,ZESTORETIC) 20-12.5 MG per tablet Take 2 tablets by mouth daily. 60 tablet 11  . warfarin (COUMADIN) 10 MG tablet Take 1 tablet daily or as directed 45 tablet 3   No current facility-administered medications for this visit.     Past Surgical History  Procedure Laterality Date  . Total knee arthroplasty      Left     Allergies  Allergen Reactions  . Iodinated Diagnostic Agents       Family History  Problem Relation Age of Onset  . Hypertension Mother   . Alzheimer's disease Father      Social History [name] reports that he has been smoking Cigarettes.  He has a 15 pack-year smoking history. He has never used smokeless tobacco. [name] reports that he does not drink alcohol.   Review of Systems CONSTITUTIONAL: No weight loss, fever, chills,  weakness or fatigue.  HEENT: Eyes: No visual loss, blurred vision, double vision or yellow sclerae.No hearing loss, sneezing, congestion, runny nose or sore throat.  SKIN: No rash or itching.  CARDIOVASCULAR: per HPI RESPIRATORY: No shortness of breath, cough or sputum.  GASTROINTESTINAL: No anorexia, nausea, vomiting or diarrhea. No abdominal pain or blood.  GENITOURINARY: No burning on urination, no polyuria NEUROLOGICAL: No headache, dizziness, syncope, paralysis, ataxia, numbness or tingling in the extremities. No change in bowel or bladder control.  MUSCULOSKELETAL: No muscle, back pain, joint pain or stiffness.  LYMPHATICS: No enlarged nodes. No history of splenectomy.  PSYCHIATRIC: No history of depression or anxiety.  ENDOCRINOLOGIC: No reports of sweating, cold or heat intolerance. No polyuria or polydipsia.  [name].   Physical Examination Filed Vitals:   08/07/14 1323  BP: 118/82  Pulse: 71   Filed Vitals:   08/07/14 1323  Height: 5\' 9"  (1.753 m)  Weight: 355 lb (161.027 kg)    Gen: resting comfortably, no acute distress HEENT: no scleral icterus, pupils equal round and reactive, no palptable cervical adenopathy,  CV: irreg, no m/r/g, no JVD Resp: Clear to auscultation bilaterally GI: abdomen is soft, non-tender, non-distended, normal bowel sounds, no hepatosplenomegaly MSK: extremities are warm, no edema.  Skin: warm, no rash Neuro:  no focal deficits Psych: appropriate affect  Diagnostic Studies  10/2007 echo SUMMARY - Left ventricular size was at the upper limits of normal. Overall    left ventricular systolic function was normal. Left    ventricular ejection fraction was estimated to be 55 %. There    were no left ventricular regional wall motion abnormalities.    Left ventricular wall thickness was mildly increased. - There was mild to moderate mitral annular calcification. - The left atrium was moderately dilated. - Borderline right  ventricular hypertrophy. - The right atrium was mild to moderately dilated.   Assessment and Plan  1. Afib  - no current symptoms  - he has not been interested in NOACs, will continue coumadin  2. HTN  - at goal, continue current meds    3. Hyperlipidemia  - f/u upcoming pcp labs, continue current statin. At goal based on most recent panel  4. Tobacco  - precontemplative, educated on risk of continued smoking and benefits of cessatoin     F/u 1 year    [name], M.D.

## 2014-08-07 NOTE — Patient Instructions (Signed)
Your physician wants you to follow-up in: 1 year with DrBranch You will receive a reminder letter in the mail two months in advance. If you don't receive a letter, please call our office to schedule the follow-up appointment.     Your physician recommends that you continue on your current medications as directed. Please refer to the Current Medication list given to you today.      Thank you for choosing [facility] !        

## 2014-08-21 ENCOUNTER — Encounter (INDEPENDENT_AMBULATORY_CARE_PROVIDER_SITE_OTHER): Payer: BLUE CROSS/BLUE SHIELD

## 2014-08-21 DIAGNOSIS — Z5181 Encounter for therapeutic drug level monitoring: Secondary | ICD-10-CM

## 2014-08-21 DIAGNOSIS — I4891 Unspecified atrial fibrillation: Secondary | ICD-10-CM

## 2014-08-26 ENCOUNTER — Ambulatory Visit (INDEPENDENT_AMBULATORY_CARE_PROVIDER_SITE_OTHER): Payer: BLUE CROSS/BLUE SHIELD | Admitting: *Deleted

## 2014-08-26 DIAGNOSIS — I480 Paroxysmal atrial fibrillation: Secondary | ICD-10-CM | POA: Diagnosis not present

## 2014-08-26 DIAGNOSIS — Z5181 Encounter for therapeutic drug level monitoring: Secondary | ICD-10-CM | POA: Diagnosis not present

## 2014-08-26 LAB — POCT INR: INR: 2.9

## 2014-09-02 ENCOUNTER — Ambulatory Visit (HOSPITAL_COMMUNITY)
Admission: RE | Admit: 2014-09-02 | Discharge: 2014-09-02 | Disposition: A | Discharge status: Home / Self Care | Payer: BLUE CROSS/BLUE SHIELD | Source: Ambulatory Visit | Attending: Family Medicine | Admitting: Family Medicine

## 2014-09-02 ENCOUNTER — Other Ambulatory Visit (HOSPITAL_COMMUNITY): Payer: Self-pay | Admitting: Family Medicine

## 2014-09-02 DIAGNOSIS — R059 Cough, unspecified: Secondary | ICD-10-CM

## 2014-09-02 DIAGNOSIS — R05 Cough: Secondary | ICD-10-CM

## 2014-09-03 ENCOUNTER — Telehealth: Payer: Self-pay | Admitting: Cardiology

## 2014-09-03 NOTE — Telephone Encounter (Signed)
I think Aurther Lofterry meant losartan/hctz

## 2014-09-03 NOTE — Telephone Encounter (Signed)
Dr.Golding took patient off of Lisinopril and started him on [place]. Wants to know if this is going to affect his Coumadin. / tg

## 2014-09-04 NOTE — Telephone Encounter (Signed)
Pt made aware

## 2014-09-04 NOTE — Telephone Encounter (Signed)
Will not affect coumadin   Dominga FerryJ Makell Cyr MD

## 2014-09-23 ENCOUNTER — Telehealth: Payer: Self-pay | Admitting: Neurology

## 2014-09-23 ENCOUNTER — Ambulatory Visit (INDEPENDENT_AMBULATORY_CARE_PROVIDER_SITE_OTHER): Payer: BLUE CROSS/BLUE SHIELD | Admitting: *Deleted

## 2014-09-23 ENCOUNTER — Telehealth: Payer: Self-pay | Admitting: *Deleted

## 2014-09-23 DIAGNOSIS — Z5181 Encounter for therapeutic drug level monitoring: Secondary | ICD-10-CM | POA: Diagnosis not present

## 2014-09-23 DIAGNOSIS — I4891 Unspecified atrial fibrillation: Secondary | ICD-10-CM

## 2014-09-23 LAB — POCT INR: INR: 2.7

## 2014-09-23 NOTE — Telephone Encounter (Signed)
[facility] on Dr. Verna CzechBranch's desk for review.

## 2014-09-23 NOTE — Telephone Encounter (Signed)
Patient is scheduled on Dr. Teofilo PodAthar's schedule for 10/15/14 @9 :30. Pt's wife wants to see if patient can be seen sooner because he is having surgery on 6/30 and Dr. Phillips OdorGolding wants the sleep study done before the surgery.

## 2014-09-30 ENCOUNTER — Telehealth: Payer: Self-pay

## 2014-09-30 NOTE — Telephone Encounter (Signed)
Pt will see Dr Wyline Mood on 6/20 at 11:30,pt to arrive at 11:15,requests INR to be done sooner than scheduled apt on 6/22

## 2014-09-30 NOTE — Telephone Encounter (Signed)
This is not our patient.

## 2014-09-30 NOTE — Telephone Encounter (Signed)
-----   Message from [name], MD sent at 09/30/2014  8:13 AM EDT ----- Regarding: RE: pre op clearance Not sure who I messaged before but he needs to see me in clinic for authorization. Can put him on for Monday June 20th even though its my half day.   Dominga Ferry MD ----- Message -----    From: Nori Riis, RN    Sent: 09/27/2014   2:17 PM      To: [name], MD Subject: pre op clearance                               Form on your desk for ortho surgery clearance.surgery sched for 6/30.I see your are off next week.Do you want to take a look at this

## 2014-10-07 ENCOUNTER — Ambulatory Visit (INDEPENDENT_AMBULATORY_CARE_PROVIDER_SITE_OTHER): Payer: BLUE CROSS/BLUE SHIELD | Admitting: *Deleted

## 2014-10-07 ENCOUNTER — Encounter (HOSPITAL_COMMUNITY): Payer: Self-pay

## 2014-10-07 ENCOUNTER — Encounter: Payer: Self-pay | Admitting: Cardiology

## 2014-10-07 ENCOUNTER — Encounter (HOSPITAL_COMMUNITY)
Admission: RE | Admit: 2014-10-07 | Discharge: 2014-10-07 | Disposition: A | Discharge status: Home / Self Care | Payer: BLUE CROSS/BLUE SHIELD | Source: Ambulatory Visit | Attending: Orthopedic Surgery | Admitting: Orthopedic Surgery

## 2014-10-07 ENCOUNTER — Ambulatory Visit (INDEPENDENT_AMBULATORY_CARE_PROVIDER_SITE_OTHER): Payer: BLUE CROSS/BLUE SHIELD | Admitting: Cardiology

## 2014-10-07 VITALS — BP 142/84 | HR 70 | Ht 68.5 in | Wt 356.0 lb

## 2014-10-07 DIAGNOSIS — M75102 Unspecified rotator cuff tear or rupture of left shoulder, not specified as traumatic: Secondary | ICD-10-CM | POA: Diagnosis not present

## 2014-10-07 DIAGNOSIS — Z5181 Encounter for therapeutic drug level monitoring: Secondary | ICD-10-CM

## 2014-10-07 DIAGNOSIS — Z01810 Encounter for preprocedural cardiovascular examination: Secondary | ICD-10-CM

## 2014-10-07 DIAGNOSIS — I4891 Unspecified atrial fibrillation: Secondary | ICD-10-CM | POA: Diagnosis not present

## 2014-10-07 DIAGNOSIS — E785 Hyperlipidemia, unspecified: Secondary | ICD-10-CM | POA: Diagnosis not present

## 2014-10-07 DIAGNOSIS — Z7901 Long term (current) use of anticoagulants: Secondary | ICD-10-CM | POA: Diagnosis not present

## 2014-10-07 DIAGNOSIS — Z01818 Encounter for other preprocedural examination: Secondary | ICD-10-CM | POA: Insufficient documentation

## 2014-10-07 DIAGNOSIS — J449 Chronic obstructive pulmonary disease, unspecified: Secondary | ICD-10-CM | POA: Insufficient documentation

## 2014-10-07 DIAGNOSIS — I10 Essential (primary) hypertension: Secondary | ICD-10-CM | POA: Diagnosis not present

## 2014-10-07 DIAGNOSIS — Z01812 Encounter for preprocedural laboratory examination: Secondary | ICD-10-CM | POA: Diagnosis not present

## 2014-10-07 DIAGNOSIS — F17200 Nicotine dependence, unspecified, uncomplicated: Secondary | ICD-10-CM | POA: Insufficient documentation

## 2014-10-07 DIAGNOSIS — M129 Arthropathy, unspecified: Secondary | ICD-10-CM | POA: Diagnosis not present

## 2014-10-07 HISTORY — DX: Chronic obstructive pulmonary disease, unspecified: J44.9

## 2014-10-07 HISTORY — DX: Cardiac arrhythmia, unspecified: I49.9

## 2014-10-07 HISTORY — DX: Reserved for inherently not codable concepts without codable children: IMO0001

## 2014-10-07 LAB — COMPREHENSIVE METABOLIC PANEL
ALT: 16 U/L — AB (ref 17–63)
AST: 21 U/L (ref 15–41)
Albumin: 3.5 g/dL (ref 3.5–5.0)
Alkaline Phosphatase: 107 U/L (ref 38–126)
Anion gap: 8 (ref 5–15)
BUN: 17 mg/dL (ref 6–20)
CALCIUM: 9.1 mg/dL (ref 8.9–10.3)
CHLORIDE: 110 mmol/L (ref 101–111)
CO2: 23 mmol/L (ref 22–32)
Creatinine, Ser: 0.77 mg/dL (ref 0.61–1.24)
GFR calc Af Amer: >60 mL/min (ref >60)
GFR calc non Af Amer: >60 mL/min (ref >60)
Glucose, Bld: 106 mg/dL — ABNORMAL HIGH (ref 65–99)
Potassium: 3.4 mmol/L — ABNORMAL LOW (ref 3.5–5.1)
Sodium: 141 mmol/L (ref 135–145)
Total Bilirubin: 0.9 mg/dL (ref 0.3–1.2)
Total Protein: 6.4 g/dL — ABNORMAL LOW (ref 6.5–8.1)

## 2014-10-07 LAB — CBC WITH DIFFERENTIAL/PLATELET
Basophils Absolute: 0 10*3/uL (ref 0.0–0.1)
Basophils Relative: 0 % (ref 0–1)
Eosinophils Absolute: 0.2 10*3/uL (ref 0.0–0.7)
Eosinophils Relative: 2 % (ref 0–5)
HCT: 43.3 % (ref 39.0–52.0)
Hemoglobin: 14.5 g/dL (ref 13.0–17.0)
LYMPHS PCT: 14 % (ref 12–46)
Lymphs Abs: 1.7 10*3/uL (ref 0.7–4.0)
MCH: 29.1 pg (ref 26.0–34.0)
MCHC: 33.5 g/dL (ref 30.0–36.0)
MCV: 86.9 fL (ref 78.0–100.0)
Monocytes Absolute: 0.9 10*3/uL (ref 0.1–1.0)
Monocytes Relative: 8 % (ref 3–12)
NEUTROS PCT: 76 % (ref 43–77)
Neutro Abs: 9.4 10*3/uL — ABNORMAL HIGH (ref 1.7–7.7)
Platelets: 195 10*3/uL (ref 150–400)
RBC: 4.98 MIL/uL (ref 4.22–5.81)
RDW: 14.8 % (ref 11.5–15.5)
WBC: 12.3 10*3/uL — ABNORMAL HIGH (ref 4.0–10.5)

## 2014-10-07 LAB — APTT: APTT: 43 s — AB (ref 24–37)

## 2014-10-07 LAB — SURGICAL PCR SCREEN
MRSA, PCR: NEGATIVE
STAPHYLOCOCCUS AUREUS: NEGATIVE

## 2014-10-07 LAB — PROTIME-INR
INR: 2.2 — ABNORMAL HIGH (ref 0.00–1.49)
Prothrombin Time: 24.2 seconds — ABNORMAL HIGH (ref 11.6–15.2)

## 2014-10-07 LAB — POCT INR: INR: 2.3

## 2014-10-07 MED ORDER — WARFARIN SODIUM 10 MG PO TABS: ORAL_TABLET | ORAL | Status: DC

## 2014-10-07 NOTE — Progress Notes (Signed)
TED sized 17x22.  Do not have teds this size.  ? Pas for DOS

## 2014-10-07 NOTE — Progress Notes (Signed)
Pt saw Dr Wyline Mood today, got cardiac clearance. Also scheduled for sleep study. To stop coumadin fri.

## 2014-10-07 NOTE — Patient Instructions (Signed)
Your physician wants you to follow-up in: 1 year with Dr. Branch  You will receive a reminder letter in the mail two months in advance. If you don't receive a letter, please call our office to schedule the follow-up appointment.  Your physician recommends that you continue on your current medications as directed. Please refer to the Current Medication list given to you today.  Thank you for choosing [facility]!!   

## 2014-10-07 NOTE — Progress Notes (Signed)
Clinical Summary Tony [name] is a 64 y.o.male seen today for follow up of the following medical problems. This is a focused visit for preoperative cardiac evaluation for upcoming surgery, for more detailed history refer to previous note   1. Preoperative evaluation - patient is being considered for left shoulder surgery - reports he can walk up flight of stairs without significant symptoms. Can walk more than 5 blocks without limitation - denies any chest pain, SOB, orthopnea, PND, or LE edema   Past Medical History  Diagnosis Date  . Hypertension   . Hyperlipidemia   . Atrial fibrillation     unknown onset  . Chronic anticoagulation   . Tobacco abuse   . Degenerative joint disease     Left TKA; right shoulder surgery also advised  . Obesity      Allergies  Allergen Reactions  . Iodinated Diagnostic Agents      Current Outpatient Prescriptions  Medication Sig Dispense Refill  . atorvastatin (LIPITOR) 40 MG tablet Take 1 tablet (40 mg total) by mouth daily. 30 tablet 6  . chlorpheniramine-HYDROcodone (TUSSIONEX) 10-8 MG/5ML SUER take 5 milliliters (1 teaspoonful) by mouth twice a day  0  . diltiazem (CARDIZEM CD) 240 MG 24 hr capsule Take 1 capsule by mouth twice a day 60 capsule 6  . lisinopril-hydrochlorothiazide (PRINZIDE,ZESTORETIC) 20-12.5 MG per tablet   1  . losartan-hydrochlorothiazide (HYZAAR) 100-12.5 MG per tablet   0  . losartan-hydrochlorothiazide (HYZAAR) 50-12.5 MG per tablet Take 1 tablet by mouth daily.    [name] TAZTIA XT 360 MG 24 hr capsule Take 360 mg by mouth daily.  0  . warfarin (COUMADIN) 10 MG tablet Take 1 tablet daily or as directed 45 tablet 3   No current facility-administered medications for this visit.     Past Surgical History  Procedure Laterality Date  . Total knee arthroplasty      Left     Allergies  Allergen Reactions  . Iodinated Diagnostic Agents       Family History  Problem Relation Age of Onset  . Hypertension  Mother   . Alzheimer's disease Father      Social History Tony [name] reports that he has been smoking Cigarettes.  He started smoking about 48 years ago. He has a 45 pack-year smoking history. He has never used smokeless tobacco. Tony [name] reports that he does not drink alcohol.   Review of Systems CONSTITUTIONAL: No weight loss, fever, chills, weakness or fatigue.  HEENT: Eyes: No visual loss, blurred vision, double vision or yellow sclerae.No hearing loss, sneezing, congestion, runny nose or sore throat.  SKIN: No rash or itching.  CARDIOVASCULAR:  RESPIRATORY: No shortness of breath, cough or sputum.  GASTROINTESTINAL: No anorexia, nausea, vomiting or diarrhea. No abdominal pain or blood.  GENITOURINARY: No burning on urination, no polyuria NEUROLOGICAL: No headache, dizziness, syncope, paralysis, ataxia, numbness or tingling in the extremities. No change in bowel or bladder control.  MUSCULOSKELETAL: No muscle, back pain, joint pain or stiffness.  LYMPHATICS: No enlarged nodes. No history of splenectomy.  PSYCHIATRIC: No history of depression or anxiety.  ENDOCRINOLOGIC: No reports of sweating, cold or heat intolerance. No polyuria or polydipsia.  [name]   Physical Examination Filed Vitals:   10/07/14 1153  BP: 142/84  Pulse: 70   Filed Vitals:   10/07/14 1153  Height: 5' 8.5" (1.74 m)  Weight: 356 lb (161.481 kg)    Gen: resting comfortably, no acute distress HEENT: no scleral icterus,  pupils equal round and reactive, no palptable cervical adenopathy,  CV: irreg, no m/r/g, no JVD Resp: Clear to auscultation bilaterally GI: abdomen is soft, non-tender, non-distended, normal bowel sounds, no hepatosplenomegaly MSK: extremities are warm, no edema.  Skin: warm, no rash Neuro:  no focal deficits Psych: appropriate affect   Diagnostic Studies  10/2007 echo SUMMARY - Left ventricular size was at the upper limits of normal. Overall    left ventricular systolic  function was normal. Left    ventricular ejection fraction was estimated to be 55 %. There    were no left ventricular regional wall motion abnormalities.    Left ventricular wall thickness was mildly increased. - There was mild to moderate mitral annular calcification. - The left atrium was moderately dilated. - Borderline right ventricular hypertrophy. - The right atrium was mild to moderately dilated.   Assessment and Plan  1. Preoperative cardiac evaluation - patient is being considered for intermediate risk shoulder surgery - he has no active acute cardiac conditions - he is able to tolerate greater than 4 METs on a regular basis without limitation - recommend proceeding with planned surgery, hold coumadin 5 days prior to surgery, resume after when ok from surgical standpoint. He does not have to be bridged.       [name], M.D.

## 2014-10-07 NOTE — Pre-Procedure Instructions (Signed)
    Tony [name]  10/07/2014      [id number] - [place] - [address] [place] [address] Phone: [phone] Fax: [phone]    Your procedure is scheduled on 10/17/14.  Report to [facility] Admitting at 8 A.M.  Call this number if you have problems the morning of surgery:  [phone]   Remember:  Do not eat food or drink liquids after midnight.  Take these medicines the morning of surgery with A SIP OF WATER cardizem   Do not wear jewelry, make-up or nail polish.  Do not wear lotions, powders, or perfumes.  You may wear deodorant.  Do not shave 48 hours prior to surgery.  Men may shave face and neck.  Do not bring valuables to the hospital.  [facility] is not responsible for any belongings or valuables.  Contacts, dentures or bridgework may not be worn into surgery.  Leave your suitcase in the car.  After surgery it may be brought to your room.  For patients admitted to the hospital, discharge time will be determined by your treatment team.  Patients discharged the day of surgery will not be allowed to drive home.   Name and phone number of your driver:    Special instructions:    Please read over the following fact sheets that you were given. Pain Booklet, Coughing and Deep Breathing, MRSA Information and Surgical Site Infection Prevention

## 2014-10-08 NOTE — Progress Notes (Signed)
Anesthesia Chart Review:  Pt is 64 year old male scheduled for L reverse shoulder arthroplasty on 10/17/2014 with Dr. Rennis Chris.   Cardiologist is Dr. Wyline Mood, last office visit 10/07/2014.  PMH includes: atrial fibrillation (on chronic anticoagulation), HTN, hyperlipidemia, COPD. Current smoker. BMI 54.   Medications include: lipitor, diltiazem, losartan-hctz, coumadin. Pt is to stop coumadin 5 days before surgery. No bridging needed.   Preoperative labs reviewed.  PTT 43, PT/INR 24.2/2.2.  Will repeat coags DOS after pt has stopped coumadin.   Chest x-ray 09/02/2014 reviewed. Mild prominence of the pulmonary interstitium suggests subsegmental atelectasis likely secondary to bronchitis. There is no alveolar pneumonia nor CHF.  EKG 08/07/2014: Atrial fibrillation. Left axis. Poor R-wave progression -may be secondary to pulmonary disease, consider old anterior infarct. Nonspecific T-abnormality. Low voltage -possible pulmonary disease  Echo 11/02/2007: - Left ventricular size was at the upper limits of normal. Overall left ventricular systolic function was normal. Left ventricular ejection fraction was estimated to be 55 %. There were no left ventricular regional wall motion abnormalities. Left ventricular wall thickness was mildly increased. - There was mild to moderate mitral annular calcification. - The left atrium was moderately dilated. - Borderline right ventricular hypertrophy. - The right atrium was mild to moderately dilated.  Pt has cardiac clearance from Dr. Wyline Mood in Epic note dated 10/07/2014.  If coags acceptable DOS, I anticipate pt can proceed with surgery as scheduled.   Rica Mast, FNP-BC [facility]Anesthesiology Phone: [phone] 10/08/2014 10:19 AM

## 2014-10-15 ENCOUNTER — Encounter: Payer: Self-pay | Admitting: Neurology

## 2014-10-15 ENCOUNTER — Ambulatory Visit (INDEPENDENT_AMBULATORY_CARE_PROVIDER_SITE_OTHER): Payer: BLUE CROSS/BLUE SHIELD | Admitting: Neurology

## 2014-10-15 VITALS — BP 119/87 | HR 79 | Resp 20 | Ht 68.5 in | Wt 347.0 lb

## 2014-10-15 DIAGNOSIS — R600 Localized edema: Secondary | ICD-10-CM

## 2014-10-15 DIAGNOSIS — Z720 Tobacco use: Secondary | ICD-10-CM

## 2014-10-15 DIAGNOSIS — R51 Headache: Secondary | ICD-10-CM

## 2014-10-15 DIAGNOSIS — G4719 Other hypersomnia: Secondary | ICD-10-CM

## 2014-10-15 DIAGNOSIS — G4733 Obstructive sleep apnea (adult) (pediatric): Secondary | ICD-10-CM

## 2014-10-15 DIAGNOSIS — I481 Persistent atrial fibrillation: Secondary | ICD-10-CM

## 2014-10-15 DIAGNOSIS — R351 Nocturia: Secondary | ICD-10-CM

## 2014-10-15 DIAGNOSIS — I4819 Other persistent atrial fibrillation: Secondary | ICD-10-CM

## 2014-10-15 DIAGNOSIS — R519 Headache, unspecified: Secondary | ICD-10-CM

## 2014-10-15 DIAGNOSIS — F17200 Nicotine dependence, unspecified, uncomplicated: Secondary | ICD-10-CM

## 2014-10-15 NOTE — Patient Instructions (Signed)

## 2014-10-15 NOTE — Progress Notes (Signed)
Subjective:    Patient ID: Tony [name] is a 64 y.o. male.  HPI     Huston Foley, MD, PhD [facility] [address] [address] [place], [place] [address]  Dear Dr. Phillips Odor,  I saw your patient, Tony [name], upon your kind request in my neurologic clinic today for initial consultation of his sleep disorder, in particular, concern for underlying obstructive sleep apnea. The patient is accompanied by his wife today. As you know, Tony [name] is a 64 year old right-handed gentleman with an underlying medical history of chronic cough, possible COPD, hypertension, arthritis, smoking, A. fib, and severe obesity, who reports snoring, and excessive daytime somnolence, and witnessed apneas per wife. He also reports morning headaches about once or twice per week. He has nocturia 2 or 3 times per night. His bedtime very use, usually between 10:51 PM and going to sleep was not a problem. He wakes up multiple times in the middle of the night. He is trying to lose weight and has lost a few pounds. He denies restless leg symptoms and twitching in his sleep but is a restless sleeper with multiple body position changes per wife. He is scheduled for left shoulder replacement surgery on 10/17/2014. He is status post bilateral knee replacement surgeries in 2005 and 2006. He still smokes about 1 pack per day. He drinks a lot of caffeine in the form of coffee in the morning, at least 3 glasses of tea at lunch and 1-2 glasses of soda per day. He does not drink enough water. He says he is borderline diabetic. His Epworth sleepiness score is 10 out of 24 today. His fatigue score is 36 out of 63 today.  His Past Medical History Is Significant For: Past Medical History  Diagnosis Date  . Hypertension   . Hyperlipidemia   . Atrial fibrillation     unknown onset  . Chronic anticoagulation   . Tobacco abuse   . Degenerative joint disease     Left TKA; right shoulder surgery also advised   . Obesity   . Dysrhythmia   . COPD (chronic obstructive pulmonary disease)   . Shortness of breath dyspnea   . Sleep apnea     His Past Surgical History Is Significant For: Past Surgical History  Procedure Laterality Date  . Total knee arthroplasty      Left  . Joint replacement    . Shoulder surgery      His Family History Is Significant For: Family History  Problem Relation Age of Onset  . Hypertension Mother   . Alzheimer's disease Father     His Social History Is Significant For: History   Social History  . Marital Status: Married    Spouse Name: N/A  . Number of Children: N/A  . Years of Education: N/A   Occupational History  . Full time    Social History Main Topics  . Smoking status: Current Every Day Smoker -- 1.00 packs/day for 30 years    Types: Cigarettes    Start date: 04/19/1966  . Smokeless tobacco: Never Used  . Alcohol Use: 0.0 oz/week    0 Standard drinks or equivalent per week     Comment: weekly  . Drug Use: No  . Sexual Activity: Not on file   Other Topics Concern  . None   Social History Narrative      No regular exercise   2-3 caffeine drinks a day     His Allergies Are:  Allergies  Allergen Reactions  . Iodinated Diagnostic Agents Itching and Swelling    Approximately 1996  :   His Current Medications Are:  Outpatient Encounter Prescriptions as of 10/15/2014  Medication Sig  . atorvastatin (LIPITOR) 40 MG tablet Take 1 tablet (40 mg total) by mouth daily. (Patient taking differently: Take 40 mg by mouth at bedtime. )  . diltiazem (CARDIZEM CD) 240 MG 24 hr capsule Take 1 capsule by mouth twice a day (Patient taking differently: Take 240 mg by mouth 2 (two) times daily. Take 1 capsule by mouth twice a day)  . losartan-hydrochlorothiazide (HYZAAR) 100-12.5 MG per tablet Take 1 tablet by mouth at bedtime.   [name] ibuprofen (ADVIL,MOTRIN) 200 MG tablet Take 400 mg by mouth daily as needed (pain).  [name] warfarin (COUMADIN) 10 MG tablet  Take 1 tablet daily or as directed (Patient not taking: Reported on 10/15/2014)   No facility-administered encounter medications on file as of 10/15/2014.  :  Review of Systems:  Out of a complete 14 point review of systems, all are reviewed and negative with the exception of these symptoms as listed below:   Review of Systems  Constitutional: Positive for fatigue.  HENT: Positive for hearing loss.   Respiratory: Positive for wheezing.   Cardiovascular: Positive for leg swelling.  Endocrine: Negative for polydipsia.  Genitourinary: Positive for difficulty urinating.  Musculoskeletal:       Joint pain, aching muscles   Neurological: Positive for headaches.       Daytime tiredness, falls asleep when sitting down, witnessed apnea, no trouble falling asleep, only wakes to use bathroom, wakes up feeling rested for a short period of time. Morning headaches.  Psychiatric/Behavioral:       Decreased energy    Objective:  Neurologic Exam  Physical Exam Physical Examination:   Filed Vitals:   10/15/14 0926  BP: 119/87  Pulse: 79  Resp: 20    General Examination: The patient is a very pleasant 63 y.o. male in no acute distress. He appears well-developed and well-nourished and adequately groomed. He is morbidly obese.  HEENT: Normocephalic, atraumatic, pupils are equal, round and reactive to light and accommodation. Funduscopic exam is normal with sharp disc margins noted. Extraocular tracking is good without limitation to gaze excursion or nystagmus noted. Normal smooth pursuit is noted. Hearing is grossly intact. Tympanic membranes are clear bilaterally. Face is symmetric with normal facial animation and normal facial sensation. Speech is clear with no dysarthria noted. There is no hypophonia. There is no lip, neck/head, jaw or voice tremor. Neck is supple with full range of passive and active motion. There are no carotid bruits on auscultation. Oropharynx exam reveals: mild mouth dryness,  adequate dental hygiene and marked airway crowding, due to narrow airway entry, tonsils are 1-2+ bilaterally, and significantly enlarged uvula as well as redundant soft palate. Mallampati is class II. Tongue protrudes centrally and palate elevates symmetrically. Neck size is 21 5/8 inches. He has a Mild overbite.   Chest: Clear to auscultation with slight wheeze, no rhonchi or crackles noted.  Heart: S1+S2+0, irregularly irregular, without murmurs, rubs or gallops noted.   Abdomen: Soft, non-tender and non-distended with normal bowel sounds appreciated on auscultation.  Extremities: There is 1+ pitting edema in the distal lower extremities bilaterally. Pedal pulses are intact.  Skin: Warm and dry without trophic changes noted. There are no varicose veins.  Musculoskeletal: exam reveals no obvious joint deformities, tenderness or joint swelling or erythema, with the exception of significant decrease  in range of motion of his right shoulder and mild decrease of range of motion in his left shoulder.   Neurologically:  Mental status: The patient is awake, alert and oriented in all 4 spheres. His immediate and remote memory, attention, language skills and fund of knowledge are appropriate. There is no evidence of aphasia, agnosia, apraxia or anomia. Speech is clear with normal prosody and enunciation. Thought process is linear. Mood is normal and affect is normal.  Cranial nerves II - XII are as described above under HEENT exam. In addition: shoulder shrug is normal with equal shoulder height noted. Motor exam: Normal bulk, strength and tone is noted. There is no drift, tremor or rebound. Romberg is negative. Reflexes are 1+ in the UEs and trace in the LEs. Babinski: Toes are flexor bilaterally. Fine motor skills and coordination: intact with normal finger taps, normal hand movements, normal rapid alternating patting, normal foot taps and normal foot agility.  Cerebellar testing: No dysmetria or  intention tremor on finger to nose testing. Heel to shin is unremarkable bilaterally. There is no truncal or gait ataxia.  Sensory exam: intact to light touch, pinprick, vibration, temperature sense in the upper and lower extremities.  Gait, station and balance: He stands with difficulty. No veering to one side is noted. No leaning to one side is noted. Posture is age-appropriate and stance is narrow based. Gait is a little slow and cautious, probably in keeping with his body habitus. Tandem walk is difficult for him, again most likely secondary to body habitus.   Assessment and Plan:   In summary, Henrietta HooverGeorge R Wist is a very pleasant 64 y.o.-year old male with an underlying medical history of chronic cough, possible COPD, hypertension, arthritis, smoking, A. fib, and severe obesity, whose history and physical exam are in keeping with obstructive sleep apnea (OSA). I had a long chat with the patient and his wife about my findings and the diagnosis of OSA, its prognosis and treatment options. We talked about medical treatments, surgical interventions and non-pharmacological approaches. I explained in particular the risks and ramifications of untreated moderate to severe OSA, especially with respect to developing cardiovascular disease down the Road, including congestive heart failure, difficult to treat hypertension, cardiac arrhythmias, or stroke. Even type 2 diabetes has, in part, been linked to untreated OSA. Symptoms of untreated OSA include daytime sleepiness, memory problems, mood irritability and mood disorder such as depression and anxiety, lack of energy, as well as recurrent headaches, especially morning headaches. We talked about smoking cessation and trying to maintain a healthy lifestyle in general, as well as the importance of weight control. I encouraged the patient to eat healthy, exercise daily and keep well hydrated, to keep a scheduled bedtime and wake time routine, to not skip any meals and  eat healthy snacks in between meals. I advised the patient not to drive when feeling sleepy. I recommended the following at this time: sleep study with potential positive airway pressure titration. (We will score hypopneas at 3% and split the sleep study into diagnostic and treatment portion, if the estimated. 2 hour AHI is >15/h).   I explained the sleep test procedure to the patient and also outlined possible surgical and non-surgical treatment options of OSA, including the use of a custom-made dental device (which would require a referral to a specialist dentist or oral surgeon), upper airway surgical options, such as pillar implants, radiofrequency surgery, tongue base surgery, and UPPP (which would involve a referral to an ENT surgeon). Rarely,  jaw surgery such as mandibular advancement may be considered.  I also explained the CPAP treatment option to the patient, who indicated that he would be willing to try CPAP if the need arises. I explained the importance of being compliant with PAP treatment, not only for insurance purposes but primarily to improve Her symptoms, and for the patient's long term health benefit, including to reduce His cardiovascular risks. I answered all their questions today and the patient and his wife were in agreement. I would like to see him back after the sleep study is completed and encouraged him to call with any interim questions, concerns, problems or updates.   Thank you very much for allowing me to participate in the care of this nice patient. If I can be of any further assistance to you please do not hesitate to call me at [phone].  Sincerely,   Huston Foley, MD, PhD

## 2014-10-16 MED ORDER — LACTATED RINGERS IV SOLN: INTRAVENOUS | Status: DC

## 2014-10-16 MED ORDER — DEXTROSE 5 % IV SOLN
3.0000 g | INTRAVENOUS | Status: AC
  Administered 2014-10-17: 3 g via INTRAVENOUS
  Filled 2014-10-16: qty 3000

## 2014-10-17 ENCOUNTER — Ambulatory Visit (HOSPITAL_COMMUNITY): Payer: BLUE CROSS/BLUE SHIELD | Admitting: Emergency Medicine

## 2014-10-17 ENCOUNTER — Observation Stay (HOSPITAL_COMMUNITY)
Admission: RE | Admit: 2014-10-17 | Discharge: 2014-10-21 | Disposition: A | Discharge status: Home Health | Payer: BLUE CROSS/BLUE SHIELD | Source: Ambulatory Visit | Attending: Orthopedic Surgery | Admitting: Orthopedic Surgery

## 2014-10-17 ENCOUNTER — Ambulatory Visit (HOSPITAL_COMMUNITY): Payer: BLUE CROSS/BLUE SHIELD | Admitting: Anesthesiology

## 2014-10-17 ENCOUNTER — Encounter (HOSPITAL_COMMUNITY)
Admission: RE | Disposition: A | Discharge status: Home Health | Payer: Self-pay | Source: Ambulatory Visit | Attending: Orthopedic Surgery

## 2014-10-17 ENCOUNTER — Encounter (HOSPITAL_COMMUNITY): Payer: Self-pay | Admitting: Certified Registered Nurse Anesthetist

## 2014-10-17 DIAGNOSIS — J449 Chronic obstructive pulmonary disease, unspecified: Secondary | ICD-10-CM | POA: Diagnosis not present

## 2014-10-17 DIAGNOSIS — Z7901 Long term (current) use of anticoagulants: Secondary | ICD-10-CM | POA: Diagnosis not present

## 2014-10-17 DIAGNOSIS — I10 Essential (primary) hypertension: Secondary | ICD-10-CM | POA: Diagnosis not present

## 2014-10-17 DIAGNOSIS — M75102 Unspecified rotator cuff tear or rupture of left shoulder, not specified as traumatic: Principal | ICD-10-CM | POA: Insufficient documentation

## 2014-10-17 DIAGNOSIS — I4891 Unspecified atrial fibrillation: Secondary | ICD-10-CM | POA: Insufficient documentation

## 2014-10-17 DIAGNOSIS — Z96619 Presence of unspecified artificial shoulder joint: Secondary | ICD-10-CM

## 2014-10-17 DIAGNOSIS — E785 Hyperlipidemia, unspecified: Secondary | ICD-10-CM | POA: Diagnosis not present

## 2014-10-17 DIAGNOSIS — M12812 Other specific arthropathies, not elsewhere classified, left shoulder: Secondary | ICD-10-CM | POA: Diagnosis present

## 2014-10-17 DIAGNOSIS — F17210 Nicotine dependence, cigarettes, uncomplicated: Secondary | ICD-10-CM | POA: Insufficient documentation

## 2014-10-17 DIAGNOSIS — Z6841 Body Mass Index (BMI) 40.0 and over, adult: Secondary | ICD-10-CM | POA: Insufficient documentation

## 2014-10-17 DIAGNOSIS — I252 Old myocardial infarction: Secondary | ICD-10-CM | POA: Diagnosis not present

## 2014-10-17 HISTORY — PX: REVERSE SHOULDER ARTHROPLASTY: SHX5054

## 2014-10-17 LAB — APTT: aPTT: 32 seconds (ref 24–37)

## 2014-10-17 LAB — GLUCOSE, CAPILLARY: Glucose-Capillary: 125 mg/dL — ABNORMAL HIGH (ref 65–99)

## 2014-10-17 LAB — PROTIME-INR
INR: 1.11 (ref 0.00–1.49)
Prothrombin Time: 14.5 seconds (ref 11.6–15.2)

## 2014-10-17 SURGERY — ARTHROPLASTY, SHOULDER, TOTAL, REVERSE
Anesthesia: Regional | Site: Shoulder | Laterality: Left

## 2014-10-17 MED ORDER — HYDROCHLOROTHIAZIDE 12.5 MG PO CAPS
12.5000 mg | ORAL_CAPSULE | Freq: Every day | ORAL | Status: DC
  Administered 2014-10-17 – 2014-10-21 (×5): 12.5 mg via ORAL
  Filled 2014-10-17 (×5): qty 1

## 2014-10-17 MED ORDER — GLYCOPYRROLATE 0.2 MG/ML IJ SOLN
INTRAMUSCULAR | Status: DC | PRN
  Administered 2014-10-17: 0.6 mg via INTRAVENOUS

## 2014-10-17 MED ORDER — WARFARIN SODIUM 7.5 MG PO TABS
15.0000 mg | ORAL_TABLET | Freq: Once | ORAL | Status: AC
  Administered 2014-10-17: 15 mg via ORAL
  Filled 2014-10-17: qty 2

## 2014-10-17 MED ORDER — FENTANYL CITRATE (PF) 100 MCG/2ML IJ SOLN
50.0000 ug | INTRAMUSCULAR | Status: DC | PRN
  Administered 2014-10-17: 50 ug via INTRAVENOUS
  Administered 2014-10-17: 150 ug via INTRAVENOUS
  Filled 2014-10-17: qty 2

## 2014-10-17 MED ORDER — DILTIAZEM HCL ER COATED BEADS 240 MG PO CP24
240.0000 mg | ORAL_CAPSULE | Freq: Two times a day (BID) | ORAL | Status: DC
  Administered 2014-10-17 – 2014-10-21 (×7): 240 mg via ORAL
  Filled 2014-10-17 (×10): qty 1

## 2014-10-17 MED ORDER — PROPOFOL 10 MG/ML IV BOLUS
INTRAVENOUS | Status: DC | PRN
Start: 2014-10-17 — End: 2014-10-17
  Administered 2014-10-17: 150 mg via INTRAVENOUS

## 2014-10-17 MED ORDER — ONDANSETRON HCL 4 MG PO TABS
4.0000 mg | ORAL_TABLET | Freq: Four times a day (QID) | ORAL | Status: DC | PRN
  Filled 2014-10-17: qty 1

## 2014-10-17 MED ORDER — CEFAZOLIN SODIUM-DEXTROSE 2-3 GM-% IV SOLR
2.0000 g | Freq: Four times a day (QID) | INTRAVENOUS | Status: AC
Start: 2014-10-17 — End: 2014-10-18
  Administered 2014-10-17 – 2014-10-18 (×3): 2 g via INTRAVENOUS
  Filled 2014-10-17 (×3): qty 50

## 2014-10-17 MED ORDER — HYDROMORPHONE HCL 1 MG/ML IJ SOLN: 1.0000 mg | INTRAMUSCULAR | Status: DC | PRN

## 2014-10-17 MED ORDER — ACETAMINOPHEN 160 MG/5ML PO SOLN
325.0000 mg | ORAL | Status: DC | PRN
  Filled 2014-10-17: qty 20.3

## 2014-10-17 MED ORDER — WARFARIN - PHARMACIST DOSING INPATIENT
Freq: Every day | Status: DC
  Administered 2014-10-17 – 2014-10-18 (×2)

## 2014-10-17 MED ORDER — LACTATED RINGERS IV SOLN
INTRAVENOUS | Status: DC
  Administered 2014-10-17 (×3): via INTRAVENOUS

## 2014-10-17 MED ORDER — ONDANSETRON HCL 4 MG/2ML IJ SOLN
4.0000 mg | Freq: Four times a day (QID) | INTRAMUSCULAR | Status: DC | PRN
  Filled 2014-10-17: qty 2

## 2014-10-17 MED ORDER — LIDOCAINE HCL (CARDIAC) 20 MG/ML IV SOLN
INTRAVENOUS | Status: DC | PRN
  Administered 2014-10-17: 80 mg via INTRAVENOUS

## 2014-10-17 MED ORDER — ONDANSETRON HCL 4 MG/2ML IJ SOLN
INTRAMUSCULAR | Status: DC | PRN
  Administered 2014-10-17: 4 mg via INTRAVENOUS

## 2014-10-17 MED ORDER — OXYCODONE HCL 5 MG PO TABS: 5.0000 mg | ORAL_TABLET | Freq: Once | ORAL | Status: DC | PRN

## 2014-10-17 MED ORDER — ROCURONIUM BROMIDE 50 MG/5ML IV SOLN
INTRAVENOUS | Status: AC
  Filled 2014-10-17: qty 1

## 2014-10-17 MED ORDER — DOCUSATE SODIUM 100 MG PO CAPS
100.0000 mg | ORAL_CAPSULE | Freq: Two times a day (BID) | ORAL | Status: DC
  Administered 2014-10-17 – 2014-10-21 (×8): 100 mg via ORAL
  Filled 2014-10-17 (×8): qty 1

## 2014-10-17 MED ORDER — ALBUTEROL SULFATE HFA 108 (90 BASE) MCG/ACT IN AERS
INHALATION_SPRAY | RESPIRATORY_TRACT | Status: AC
  Filled 2014-10-17: qty 6.7

## 2014-10-17 MED ORDER — MIDAZOLAM HCL 2 MG/2ML IJ SOLN
1.0000 mg | INTRAMUSCULAR | Status: DC | PRN
  Administered 2014-10-17: 2 mg via INTRAVENOUS
  Administered 2014-10-17: 1 mg via INTRAVENOUS
  Filled 2014-10-17: qty 2

## 2014-10-17 MED ORDER — ACETAMINOPHEN 650 MG RE SUPP: 650.0000 mg | Freq: Four times a day (QID) | RECTAL | Status: DC | PRN

## 2014-10-17 MED ORDER — LACTATED RINGERS IV SOLN
INTRAVENOUS | Status: DC
  Administered 2014-10-17: 18:00:00 via INTRAVENOUS

## 2014-10-17 MED ORDER — BUPIVACAINE-EPINEPHRINE (PF) 0.5% -1:200000 IJ SOLN
INTRAMUSCULAR | Status: DC | PRN
  Administered 2014-10-17: 25 mL via PERINEURAL

## 2014-10-17 MED ORDER — SUCCINYLCHOLINE CHLORIDE 20 MG/ML IJ SOLN
INTRAMUSCULAR | Status: DC | PRN
  Administered 2014-10-17: 80 mg via INTRAVENOUS

## 2014-10-17 MED ORDER — OXYCODONE HCL 5 MG/5ML PO SOLN: 5.0000 mg | Freq: Once | ORAL | Status: DC | PRN

## 2014-10-17 MED ORDER — OXYCODONE HCL 5 MG PO TABS
5.0000 mg | ORAL_TABLET | ORAL | Status: DC | PRN
  Administered 2014-10-17 (×2): 10 mg via ORAL
  Administered 2014-10-18: 5 mg via ORAL
  Administered 2014-10-18: 10 mg via ORAL
  Filled 2014-10-17 (×4): qty 2

## 2014-10-17 MED ORDER — MENTHOL 3 MG MT LOZG: 1.0000 | LOZENGE | OROMUCOSAL | Status: DC | PRN

## 2014-10-17 MED ORDER — DIAZEPAM 5 MG PO TABS: 5.0000 mg | ORAL_TABLET | Freq: Four times a day (QID) | ORAL | Status: DC | PRN

## 2014-10-17 MED ORDER — POLYETHYLENE GLYCOL 3350 17 G PO PACK
17.0000 g | PACK | Freq: Every day | ORAL | Status: DC | PRN
  Administered 2014-10-19 – 2014-10-20 (×2): 17 g via ORAL
  Filled 2014-10-17 (×2): qty 1

## 2014-10-17 MED ORDER — METOCLOPRAMIDE HCL 5 MG PO TABS
5.0000 mg | ORAL_TABLET | Freq: Three times a day (TID) | ORAL | Status: DC | PRN
  Filled 2014-10-17: qty 2

## 2014-10-17 MED ORDER — DEXTROSE 5 % IV SOLN
10.0000 mg | INTRAVENOUS | Status: DC | PRN
  Administered 2014-10-17: 100 ug/min via INTRAVENOUS

## 2014-10-17 MED ORDER — WARFARIN SODIUM 5 MG PO TABS: 10.0000 mg | ORAL_TABLET | Freq: Every day | ORAL | Status: DC

## 2014-10-17 MED ORDER — FENTANYL CITRATE (PF) 250 MCG/5ML IJ SOLN
INTRAMUSCULAR | Status: AC
  Filled 2014-10-17: qty 5

## 2014-10-17 MED ORDER — MIDAZOLAM HCL 2 MG/2ML IJ SOLN
INTRAMUSCULAR | Status: AC
  Filled 2014-10-17: qty 2

## 2014-10-17 MED ORDER — LACTATED RINGERS IV SOLN: INTRAVENOUS | Status: DC

## 2014-10-17 MED ORDER — ALUM & MAG HYDROXIDE-SIMETH 200-200-20 MG/5ML PO SUSP: 30.0000 mL | ORAL | Status: DC | PRN

## 2014-10-17 MED ORDER — ACETAMINOPHEN 325 MG PO TABS
650.0000 mg | ORAL_TABLET | Freq: Four times a day (QID) | ORAL | Status: DC | PRN
  Administered 2014-10-18 – 2014-10-20 (×5): 650 mg via ORAL
  Filled 2014-10-17 (×5): qty 2

## 2014-10-17 MED ORDER — BISACODYL 5 MG PO TBEC: 5.0000 mg | DELAYED_RELEASE_TABLET | Freq: Every day | ORAL | Status: DC | PRN

## 2014-10-17 MED ORDER — NEOSTIGMINE METHYLSULFATE 10 MG/10ML IV SOLN
INTRAVENOUS | Status: DC | PRN
  Administered 2014-10-17: 4 mg via INTRAVENOUS

## 2014-10-17 MED ORDER — ROCURONIUM BROMIDE 100 MG/10ML IV SOLN
INTRAVENOUS | Status: DC | PRN
  Administered 2014-10-17: 50 mg via INTRAVENOUS

## 2014-10-17 MED ORDER — METOCLOPRAMIDE HCL 5 MG/ML IJ SOLN
5.0000 mg | Freq: Three times a day (TID) | INTRAMUSCULAR | Status: DC | PRN
  Filled 2014-10-17: qty 2

## 2014-10-17 MED ORDER — LOSARTAN POTASSIUM-HCTZ 100-12.5 MG PO TABS: 1.0000 | ORAL_TABLET | Freq: Every day | ORAL | Status: DC

## 2014-10-17 MED ORDER — NEOSTIGMINE METHYLSULFATE 10 MG/10ML IV SOLN
INTRAVENOUS | Status: AC
  Filled 2014-10-17: qty 1

## 2014-10-17 MED ORDER — LOSARTAN POTASSIUM 50 MG PO TABS
100.0000 mg | ORAL_TABLET | Freq: Every day | ORAL | Status: DC
  Administered 2014-10-17 – 2014-10-21 (×5): 100 mg via ORAL
  Filled 2014-10-17 (×5): qty 2

## 2014-10-17 MED ORDER — PHENOL 1.4 % MT LIQD: 1.0000 | OROMUCOSAL | Status: DC | PRN

## 2014-10-17 MED ORDER — FENTANYL CITRATE (PF) 100 MCG/2ML IJ SOLN: 25.0000 ug | INTRAMUSCULAR | Status: DC | PRN

## 2014-10-17 MED ORDER — DEXTROSE 5 % IV SOLN: 10.0000 mg | INTRAVENOUS | Status: DC | PRN

## 2014-10-17 MED ORDER — GLYCOPYRROLATE 0.2 MG/ML IJ SOLN
INTRAMUSCULAR | Status: AC
  Filled 2014-10-17: qty 3

## 2014-10-17 MED ORDER — ACETAMINOPHEN 325 MG PO TABS: 325.0000 mg | ORAL_TABLET | ORAL | Status: DC | PRN

## 2014-10-17 MED ORDER — MAGNESIUM CITRATE PO SOLN: 1.0000 | Freq: Once | ORAL | Status: AC | PRN

## 2014-10-17 MED ORDER — PROPOFOL 10 MG/ML IV BOLUS
INTRAVENOUS | Status: AC
  Filled 2014-10-17: qty 20

## 2014-10-17 MED ORDER — ATORVASTATIN CALCIUM 40 MG PO TABS
40.0000 mg | ORAL_TABLET | Freq: Every day | ORAL | Status: DC
  Administered 2014-10-17 – 2014-10-20 (×4): 40 mg via ORAL
  Filled 2014-10-17 (×4): qty 1

## 2014-10-17 MED ORDER — IPRATROPIUM BROMIDE HFA 17 MCG/ACT IN AERS
INHALATION_SPRAY | RESPIRATORY_TRACT | Status: DC | PRN
  Administered 2014-10-17 (×2): 2 via RESPIRATORY_TRACT

## 2014-10-17 SURGICAL SUPPLY — 79 items
ADH SKN CLS APL DERMABOND .7 (GAUZE/BANDAGES/DRESSINGS) ×1
ADH SKN CLS LQ APL DERMABOND (GAUZE/BANDAGES/DRESSINGS) ×1
AID PSTN UNV HD RSTRNT DISP (MISCELLANEOUS) ×1
BIT DRILL 170X2.5X (BIT) IMPLANT
BIT DRL 170X2.5X (BIT)
BLADE SAW SGTL 83.5X18.5 (BLADE) ×2 IMPLANT
BRUSH FEMORAL CANAL (MISCELLANEOUS) IMPLANT
CAPT SHLDR REVTOTAL 1 ×1 IMPLANT
CEMENT BONE DEPUY (Cement) ×2 IMPLANT
COVER SURGICAL LIGHT HANDLE (MISCELLANEOUS) ×2 IMPLANT
DERMABOND ADHESIVE PROPEN (GAUZE/BANDAGES/DRESSINGS) ×1
DERMABOND ADVANCED (GAUZE/BANDAGES/DRESSINGS) ×1
DERMABOND ADVANCED .7 DNX12 (GAUZE/BANDAGES/DRESSINGS) ×1 IMPLANT
DERMABOND ADVANCED .7 DNX6 (GAUZE/BANDAGES/DRESSINGS) IMPLANT
DRAPE SURG 17X11 SM STRL (DRAPES) ×2 IMPLANT
DRAPE U-SHAPE 47X51 STRL (DRAPES) ×2 IMPLANT
DRILL 2.5 (BIT)
DRILL BIT 7/64X5 (BIT) ×2 IMPLANT
DRSG AQUACEL AG ADV 3.5X10 (GAUZE/BANDAGES/DRESSINGS) ×2 IMPLANT
DRSG MEPILEX BORDER 4X8 (GAUZE/BANDAGES/DRESSINGS) IMPLANT
DURAPREP 26ML APPLICATOR (WOUND CARE) ×2 IMPLANT
ELECT BLADE 4.0 EZ CLEAN MEGAD (MISCELLANEOUS) ×2
ELECT CAUTERY BLADE 6.4 (BLADE) ×2 IMPLANT
ELECT REM PT RETURN 9FT ADLT (ELECTROSURGICAL) ×2
ELECTRODE BLDE 4.0 EZ CLN MEGD (MISCELLANEOUS) ×1 IMPLANT
ELECTRODE REM PT RTRN 9FT ADLT (ELECTROSURGICAL) ×1 IMPLANT
FACESHIELD WRAPAROUND (MASK) ×6 IMPLANT
FACESHIELD WRAPAROUND OR TEAM (MASK) ×3 IMPLANT
GLOVE BIO SURGEON STRL SZ7.5 (GLOVE) ×4 IMPLANT
GLOVE BIO SURGEON STRL SZ8 (GLOVE) ×2 IMPLANT
GLOVE BIOGEL PI IND STRL 6.5 (GLOVE) IMPLANT
GLOVE BIOGEL PI INDICATOR 6.5 (GLOVE) ×1
GLOVE ECLIPSE 6.5 STRL STRAW (GLOVE) ×2 IMPLANT
GLOVE EUDERMIC 7 POWDERFREE (GLOVE) ×2 IMPLANT
GLOVE SS BIOGEL STRL SZ 7.5 (GLOVE) ×1 IMPLANT
GLOVE SUPERSENSE BIOGEL SZ 7.5 (GLOVE) ×1
GOWN STRL REUS W/ TWL LRG LVL3 (GOWN DISPOSABLE) IMPLANT
GOWN STRL REUS W/ TWL XL LVL3 (GOWN DISPOSABLE) ×2 IMPLANT
GOWN STRL REUS W/TWL LRG LVL3 (GOWN DISPOSABLE)
GOWN STRL REUS W/TWL XL LVL3 (GOWN DISPOSABLE) ×4
HANDPIECE INTERPULSE COAX TIP (DISPOSABLE)
KIT BASIN OR (CUSTOM PROCEDURE TRAY) ×2 IMPLANT
KIT ROOM TURNOVER OR (KITS) ×2 IMPLANT
MANIFOLD NEPTUNE II (INSTRUMENTS) ×2 IMPLANT
NDL HYPO 25GX1X1/2 BEV (NEEDLE) IMPLANT
NDL SUT 6 .5 CRC .975X.05 MAYO (NEEDLE) IMPLANT
NEEDLE HYPO 25GX1X1/2 BEV (NEEDLE) IMPLANT
NEEDLE MAYO TAPER (NEEDLE)
NS IRRIG 1000ML POUR BTL (IV SOLUTION) ×2 IMPLANT
PACK SHOULDER (CUSTOM PROCEDURE TRAY) ×2 IMPLANT
PAD ARMBOARD 7.5X6 YLW CONV (MISCELLANEOUS) ×4 IMPLANT
PASSER SUT SWANSON 36MM LOOP (INSTRUMENTS) IMPLANT
PIN GUIDE 1.2 (PIN) IMPLANT
PIN GUIDE GLENOPHERE 1.5MX300M (PIN) IMPLANT
PIN METAGLENE 2.5 (PIN) IMPLANT
PRESSURIZER FEMORAL UNIV (MISCELLANEOUS) IMPLANT
RESTRAINT HEAD UNIVERSAL NS (MISCELLANEOUS) ×2 IMPLANT
SET HNDPC FAN SPRY TIP SCT (DISPOSABLE) IMPLANT
SLING ARM FOAM STRAP LRG (SOFTGOODS) IMPLANT
SPONGE LAP 18X18 X RAY DECT (DISPOSABLE) ×4 IMPLANT
SPONGE LAP 4X18 X RAY DECT (DISPOSABLE) IMPLANT
SUCTION FRAZIER TIP 10 FR DISP (SUCTIONS) ×2 IMPLANT
SUT BONE WAX W31G (SUTURE) IMPLANT
SUT FIBERWIRE #2 38 T-5 BLUE (SUTURE) ×4
SUT MNCRL AB 3-0 PS2 18 (SUTURE) ×2 IMPLANT
SUT VIC AB 1 CT1 27 (SUTURE) ×2
SUT VIC AB 1 CT1 27XBRD ANBCTR (SUTURE) ×1 IMPLANT
SUT VIC AB 2-0 CT1 27 (SUTURE) ×4
SUT VIC AB 2-0 CT1 TAPERPNT 27 (SUTURE) ×1 IMPLANT
SUT VIC AB 2-0 SH 27 (SUTURE)
SUT VIC AB 2-0 SH 27X BRD (SUTURE) IMPLANT
SUTURE FIBERWR #2 38 T-5 BLUE (SUTURE) ×2 IMPLANT
SYR 30ML SLIP (SYRINGE) ×2 IMPLANT
SYR CONTROL 10ML LL (SYRINGE) IMPLANT
TOWEL OR 17X24 6PK STRL BLUE (TOWEL DISPOSABLE) ×2 IMPLANT
TOWEL OR 17X26 10 PK STRL BLUE (TOWEL DISPOSABLE) ×2 IMPLANT
TOWER CARTRIDGE SMART MIX (DISPOSABLE) IMPLANT
TRAY FOLEY CATH 16FRSI W/METER (SET/KITS/TRAYS/PACK) IMPLANT
WATER STERILE IRR 1000ML POUR (IV SOLUTION) ×2 IMPLANT

## 2014-10-17 NOTE — Anesthesia Preprocedure Evaluation (Signed)
Anesthesia Evaluation  Patient identified by MRN, date of birth, ID band Patient awake    Reviewed: Allergy & Precautions, NPO status , Patient's Chart, lab work & pertinent test results  History of Anesthesia Complications Negative for: history of anesthetic complications  Airway Mallampati: II  TM Distance: >3 FB Neck ROM: Full    Dental  (+) Teeth Intact   Pulmonary shortness of breath and with exertion, COPDCurrent Smoker,  breath sounds clear to auscultation        Cardiovascular hypertension, Pt. on medications - angina- Past MI and - CHF + dysrhythmias Atrial Fibrillation Rhythm:Irregular     Neuro/Psych negative neurological ROS  negative psych ROS   GI/Hepatic negative GI ROS, Neg liver ROS,   Endo/Other  Morbid obesity  Renal/GU negative Renal ROS     Musculoskeletal  (+) Arthritis -,   Abdominal   Peds  Hematology negative hematology ROS (+)   Anesthesia Other Findings   Reproductive/Obstetrics                             Anesthesia Physical Anesthesia Plan  ASA: III  Anesthesia Plan: Regional and General   Post-op Pain Management:    Induction: Intravenous  Airway Management Planned: Oral ETT  Additional Equipment: None  Intra-op Plan:   Post-operative Plan: Extubation in OR  Informed Consent: I have reviewed the patients History and Physical, chart, labs and discussed the procedure including the risks, benefits and alternatives for the proposed anesthesia with the patient or authorized representative who has indicated his/her understanding and acceptance.   Dental advisory given  Plan Discussed with: CRNA and Surgeon  Anesthesia Plan Comments:         Anesthesia Quick Evaluation

## 2014-10-17 NOTE — Progress Notes (Signed)
ANTICOAGULATION CONSULT NOTE - Initial Consult  Pharmacy Consult:  Coumadin Indication:  History of AFib + VTE prophylaxis s/p left shoulder arthroplasty  Allergies  Allergen Reactions  . Iodinated Diagnostic Agents Itching and Swelling    Approximately 1996    Patient Measurements: Weight: (!) 347 lb (157.398 kg)  Vital Signs: Temp: 97.5 F (36.4 C) (06/30 1626) Temp Source: Oral (06/30 1626) BP: 105/69 mmHg (06/30 1626) Pulse Rate: 73 (06/30 1626)  Labs:  Recent Labs  10/17/14 0835  APTT 32  LABPROT 14.5  INR 1.11    Estimated Creatinine Clearance: 138.1 mL/min (by C-G formula based on Cr of 0.77).   Medical History: Past Medical History  Diagnosis Date  . Hypertension   . Hyperlipidemia   . Atrial fibrillation     unknown onset  . Chronic anticoagulation   . Tobacco abuse   . Degenerative joint disease     Left TKA; right shoulder surgery also advised  . Obesity   . Dysrhythmia   . COPD (chronic obstructive pulmonary disease)   . Shortness of breath dyspnea   . Sleep apnea       Assessment: 63 YOM on Coumadin PTA for history of Afib.  Now to resume s/p left shoulder arthroplasty.  Patient was previously therapeutic on home regimen of 10mg  PO daily.  His last dose was on 10/11/14 and INR is currently sub-therapeutic.  No bleeding reported.   Goal of Therapy:  INR 2-3    Plan:  - Coumadin 15mg  PO today - Daily PT / INR - Education provided    Honesti Seaberg D. Laney Potashang, PharmD, BCPS Pager:  [phone] 10/17/2014, 5:31 PM

## 2014-10-17 NOTE — Op Note (Signed)
10/17/2014  1:05 PM  PATIENT:   Tony HooverGeorge R [name]  64 y.o. male  PRE-OPERATIVE DIAGNOSIS:  LEFT SHOULDER ROTATOR CUFF TEAR,ARTHOPATHY  POST-OPERATIVE DIAGNOSIS:  same  PROCEDURE:  L RSA #16/1 epi, +3 poly, 42 eccentric glenosphere  SURGEON:  Zanden Colver, Vania ReaKevin M. M.D.  ASSISTANTS: Shuford pac   ANESTHESIA:   GET + ISB  EBL: 350  SPECIMEN:  none  Drains: none   PATIENT DISPOSITION:  PACU - hemodynamically stable.    PLAN OF CARE: Admit to inpatient   Dictation# [id number]   Contact # [phone]

## 2014-10-17 NOTE — Transfer of Care (Signed)
Immediate Anesthesia Transfer of Care Note  Patient: Tony [name]  Procedure(s) Performed: Procedure(s): LEFT REVERSE SHOULDER ARTHROPLASTY (Left)  Patient Location: PACU  Anesthesia Type:Regional  Level of Consciousness: awake, alert , oriented, patient cooperative and responds to stimulation  Airway & Oxygen Therapy: Patient Spontanous Breathing and Patient connected to face mask oxygen  Post-op Assessment: Report given to RN, Post -op Vital signs reviewed and stable and Patient moving all extremities X 4  Post vital signs: Reviewed and stable  Last Vitals:  Filed Vitals:   10/17/14 1030  BP: 120/84  Pulse: 71  Temp:   Resp: 19    Complications: No apparent anesthesia complications

## 2014-10-17 NOTE — H&P (Signed)
Tony [name]    Chief Complaint: LEFT SHOULDER ROTATAR CUFF TEAR,ARTHOPATHY HPI: The patient is a 64 y.o. 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 Hypertension   . Hyperlipidemia   . Atrial fibrillation     unknown onset  . Chronic anticoagulation   . Tobacco abuse   . Degenerative joint disease     Left TKA; right shoulder surgery also advised  . Obesity   . Dysrhythmia   . COPD (chronic obstructive pulmonary disease)   . Shortness of breath dyspnea   . Sleep apnea     Past Surgical History  Procedure Laterality Date  . Total knee arthroplasty      Left  . Joint replacement    . Shoulder surgery      Family History  Problem Relation Age of Onset  . Hypertension Mother   . Alzheimer's disease Father     Social History:  reports that he has been smoking Cigarettes.  He started smoking about 48 years ago. He has a 30 pack-year smoking history. He has never used smokeless tobacco. He reports that he drinks alcohol. He reports that he does not use illicit drugs.  Allergies:  Allergies  Allergen Reactions  . Iodinated Diagnostic Agents Itching and Swelling    Approximately 1996    Medications Prior to Admission  Medication Sig Dispense Refill  . atorvastatin (LIPITOR) 40 MG tablet Take 1 tablet (40 mg total) by mouth daily. (Patient taking differently: Take 40 mg by mouth at bedtime. ) 30 tablet 6  . diltiazem (CARDIZEM CD) 240 MG 24 hr capsule Take 1 capsule by mouth twice a day (Patient taking differently: Take 240 mg by mouth 2 (two) times daily. Take 1 capsule by mouth twice a day) 60 capsule 6  . ibuprofen (ADVIL,MOTRIN) 200 MG tablet Take 400 mg by mouth daily as needed (pain).    [name]. losartan-hydrochlorothiazide (HYZAAR) 100-12.5 MG per tablet Take 1 tablet by mouth at bedtime.   0  . warfarin (COUMADIN) 10 MG tablet Take 1 tablet daily or as directed (Patient not taking: Reported on 10/15/2014) 45  tablet 3     Physical Exam: left shoulder with painful and restricted motion as noted at recent office visits  Vitals  Temp:  [97.8 F (36.6 C)] 97.8 F (36.6 C) (06/30 0817) Pulse Rate:  [64-85] 64 (06/30 1010) Resp:  [13-18] 13 (06/30 1010) BP: (132-156)/(80-108) 156/108 mmHg (06/30 1010) SpO2:  [94 %-97 %] 96 % (06/30 1010) Weight:  [157.398 kg (347 lb)] 157.398 kg (347 lb) (06/30 0817)  Assessment/Plan  Impression: LEFT SHOULDER ROTATAR CUFF TEAR,ARTHOPATHY  Plan of Action: Procedure(s): LEFT REVERSE SHOULDER ARTHROPLASTY  Tynisa Vohs M 10/17/2014, 10:17 AM Contact # [phone]

## 2014-10-17 NOTE — Progress Notes (Signed)
Waiting on 5n bed

## 2014-10-17 NOTE — Anesthesia Procedure Notes (Addendum)
Procedure Name: Intubation Date/Time: 10/17/2014 11:09 AM Performed by: Patrcia DollyMOSES, VEDWATTIE Pre-anesthesia Checklist: Patient identified, Patient being monitored, Timeout performed, Emergency Drugs available and Suction available Patient Re-evaluated:Patient Re-evaluated prior to inductionOxygen Delivery Method: Circle System Utilized Preoxygenation: Pre-oxygenation with 100% oxygen Intubation Type: IV induction Ventilation: Mask ventilation without difficulty Laryngoscope Size: 3 and Miller Grade View: Grade I Tube type: Oral Tube size: 7.0 mm Number of attempts: 1 Airway Equipment and Method: Stylet Placement Confirmation: ETT inserted through vocal cords under direct vision,  positive ETCO2 and breath sounds checked- equal and bilateral Secured at: 21 cm Tube secured with: Tape Dental Injury: Teeth and Oropharynx as per pre-operative assessment     Anesthesia Regional Block:  Interscalene brachial plexus block  Pre-Anesthetic Checklist: ,, timeout performed, Correct Patient, Correct Site, Correct Laterality, Correct Procedure, Correct Position, site marked, Risks and benefits discussed,  Surgical consent,  Pre-op evaluation,  At surgeon's request and post-op pain management  Laterality: Upper and Left  Prep: chloraprep       Needles:  Injection technique: Single-shot  Needle Type: Echogenic Stimulator Needle          Additional Needles:  Procedures: ultrasound guided (picture in chart) and nerve stimulator Interscalene brachial plexus block  Nerve Stimulator or Paresthesia:  Response: deltoid, 0.5 mA,   Additional Responses:   Narrative:  Injection made incrementally with aspirations every 5 mL.  Performed by: Personally  Anesthesiologist: Lindie Roberson, CHRIS  Additional Notes: H+P and labs reviewed, risks and benefits discussed with patient, procedure tolerated well without complications

## 2014-10-17 NOTE — Anesthesia Postprocedure Evaluation (Signed)
  Anesthesia Post-op Note  Patient: Tony HooverGeorge R [name]  Procedure(s) Performed: Procedure(s): LEFT REVERSE SHOULDER ARTHROPLASTY (Left)  Patient Location: PACU  Anesthesia Type:General and GA combined with regional for post-op pain  Level of Consciousness: awake, alert  and oriented  Airway and Oxygen Therapy: Patient Spontanous Breathing and Patient connected to nasal cannula oxygen  Post-op Pain: none  Post-op Assessment: Post-op Vital signs reviewed, Patient's Cardiovascular Status Stable, Respiratory Function Stable, Patent Airway and Pain level controlled              Post-op Vital Signs: stable  Last Vitals:  Filed Vitals:   10/17/14 1600  BP:   Pulse:   Temp: 36.2 C  Resp:     Complications: No apparent anesthesia complications

## 2014-10-17 NOTE — Discharge Instructions (Addendum)
Information on my medicine - Coumadin®   (Warfarin) ° °This medication education was reviewed with me or my healthcare representative as part of my discharge preparation.  The pharmacist that spoke with me during my hospital stay was:  Dang, Thuy Dien, RPH ° °Why was Coumadin prescribed for you? °Coumadin was prescribed for you because you have a blood clot or a medical condition that can cause an increased risk of forming blood clots. Blood clots can cause serious health problems by blocking the flow of blood to the heart, lung, or brain. Coumadin can prevent harmful blood clots from forming. °As a reminder your indication for Coumadin is:   Stroke Prevention Because Of Atrial Fibrillation ° °What test will check on my response to Coumadin? °While on Coumadin (warfarin) you will need to have an INR test regularly to ensure that your dose is keeping you in the desired range. The INR (international normalized ratio) number is calculated from the result of the laboratory test called prothrombin time (PT). ° °If an INR APPOINTMENT HAS NOT ALREADY BEEN MADE FOR YOU please schedule an appointment to have this lab work done by your health care provider within 7 days. °Your INR goal is usually a number between:  2 to 3 or your provider may give you a more narrow range like 2-2.5.  Ask your health care provider during an office visit what your goal INR is. ° °What  do you need to  know  About  COUMADIN? °Take Coumadin (warfarin) exactly as prescribed by your healthcare provider about the same time each day.  DO NOT stop taking without talking to the doctor who prescribed the medication.  Stopping without other blood clot prevention medication to take the place of Coumadin may increase your risk of developing a new clot or stroke.  Get refills before you run out. ° °What do you do if you miss a dose? °If you miss a dose, take it as soon as you remember on the same day then continue your regularly scheduled regimen the next  day.  Do not take two doses of Coumadin at the same time. ° °Important Safety Information °A possible side effect of Coumadin (Warfarin) is an increased risk of bleeding. You should call your healthcare provider right away if you experience any of the following: °? Bleeding from an injury or your nose that does not stop. °? Unusual colored urine (red or dark brown) or unusual colored stools (red or black). °? Unusual bruising for unknown reasons. °? A serious fall or if you hit your head (even if there is no bleeding). ° °Some foods or medicines interact with Coumadin® (warfarin) and might alter your response to warfarin. To help avoid this: °? Eat a balanced diet, maintaining a consistent amount of Vitamin K. °? Notify your provider about major diet changes you plan to make. °? Avoid alcohol or limit your intake to 1 drink for women and 2 drinks for men per day. °(1 drink is 5 oz. wine, 12 oz. beer, or 1.5 oz. liquor.) ° °Make sure that ANY health care provider who prescribes medication for you knows that you are taking Coumadin (warfarin).  Also make sure the healthcare provider who is monitoring your Coumadin knows when you have started a new medication including herbals and non-prescription products. ° °Coumadin® (Warfarin)  Major Drug Interactions  °Increased Warfarin Effect Decreased Warfarin Effect  °Alcohol (large quantities) °Antibiotics (esp. Septra/Bactrim, Flagyl, Cipro) °Amiodarone (Cordarone) °Aspirin (ASA) °Cimetidine (Tagamet) °Megestrol (Megace) °NSAIDs (ibuprofen,   naproxen, etc.) Piroxicam (Feldene) Propafenone (Rythmol SR) Propranolol (Inderal) Isoniazid (INH) Posaconazole (Noxafil) Barbiturates (Phenobarbital) Carbamazepine (Tegretol) Chlordiazepoxide (Librium) Cholestyramine (Questran) Griseofulvin Oral Contraceptives Rifampin Sucralfate (Carafate) Vitamin K   Coumadin (Warfarin) Major Herbal Interactions  Increased Warfarin Effect Decreased Warfarin Effect   Garlic Ginseng Ginkgo biloba Coenzyme Q10 Green tea St. Johns wort    Coumadin (Warfarin) FOOD Interactions  Eat a consistent number of servings per week of foods HIGH in Vitamin K (1 serving =  cup)  Collards (cooked, or boiled & drained) Kale (cooked, or boiled & drained) Mustard greens (cooked, or boiled & drained) Parsley *serving size only =  cup Spinach (cooked, or boiled & drained) Swiss chard (cooked, or boiled & drained) Turnip greens (cooked, or boiled & drained)  Eat a consistent number of servings per week of foods MEDIUM-HIGH in Vitamin K (1 serving = 1 cup)  Asparagus (cooked, or boiled & drained) Broccoli (cooked, boiled & drained, or raw & chopped) Brussel sprouts (cooked, or boiled & drained) *serving size only =  cup Lettuce, raw (green leaf, endive, romaine) Spinach, raw Turnip greens, raw & chopped   These websites have more information on Coumadin (warfarin):  http://www.king-russell.com/www.coumadin.com; https://www.hines.net/www.ahrq.gov/consumer/coumadin.htm;     Vania ReaKevin M. Supple, M.D., F.A.A.O.S. [facility] Specializing in Arthroscopic and Reconstructive Surgery of the Shoulder and Knee [phone] [address] [place], [place] [id number] - Fax [phone]   POST-OP TOTAL SHOULDER REPLACEMENT/SHOULDER HEMIARTHROPLASTY INSTRUCTIONS  1. Call the office at [phone] to schedule your first post-op appointment 10-14 days from the date of your surgery.  2. The bandage over your incision is waterproof. You may begin showering with this dressing on. You may leave this dressing on until first follow up appointment within 2 weeks. If you would like to remove it you may do so after the 5th day. Go slow and tug at the borders gently to break the bond the dressing has with the skin. The steri strips may come off with the dressing. At this point if there is no drainage it is okay to go without a bandage or you may cover it with a light guaze and tape. Leave the steri-strips in  place over your incision. You can expect drainage that is bloody or yellow in nature that should gradually decrease from day of surgery. Change your dressing daily until drainage is completely resolved, then you may feel free to go without a bandage. You can also expect significant bruising around your shoulder that will drift down your arm and into your chest wall. This is very normal and should resolve over several days.   3. Wear your sling/immobilizer at all times except to perform the exercises below or to occasionally let your arm dangle by your side to stretch your elbow. You also need to sleep in your sling immobilizer until instructed otherwise.  4. Range of motion to your elbow, wrist, and hand are encouraged 3-5 times daily. Exercise to your hand and fingers helps to reduce swelling you may experience.  5. Utilize ice to the shoulder 3-5 times minimum a day and additionally if you are experiencing pain.  6. Prescriptions for a pain medication and a muscle relaxant are provided for you. It is recommended that if you are experiencing pain that you pain medication alone is not controlling, add the muscle relaxant along with the pain medication which can give additional pain relief. The first 1-2 days is generally the most severe of your pain and then should gradually decrease. As  your pain lessens it is recommended that you decrease your use of the pain medications to an "as needed basis'" only and to always comply with the recommended dosages of the pain medications.  7. Pain medications can produce constipation along with their use. If you experience this, the use of an over the counter stool softener or laxative daily is recommended.   8. For most patients, if insurance allows, home health services to include therapy has been arranged.  9. For additional questions or concerns, please do not hesitate to call the office. If after hours there is an answering service to forward your concerns to  the physician on call.  POST-OP EXERCISES  Pendulum Exercises  Perform pendulum exercises while standing and bending at the waist. Support your uninvolved arm on a table or chair and allow your operated arm to hang freely. Make sure to do these exercises passively - not using you shoulder muscles.  Repeat 20 times. Do 3 sessions per day.      Contact Primary Care Physician to have oxygen saturation and oxygen needs assessed - given recently mentioned concern for COPD

## 2014-10-18 ENCOUNTER — Encounter (HOSPITAL_COMMUNITY): Payer: Self-pay | Admitting: Orthopedic Surgery

## 2014-10-18 DIAGNOSIS — M75102 Unspecified rotator cuff tear or rupture of left shoulder, not specified as traumatic: Secondary | ICD-10-CM | POA: Diagnosis not present

## 2014-10-18 LAB — PROTIME-INR
INR: 1.24 (ref 0.00–1.49)
PROTHROMBIN TIME: 15.8 s — AB (ref 11.6–15.2)

## 2014-10-18 MED ORDER — WARFARIN SODIUM 5 MG PO TABS
10.0000 mg | ORAL_TABLET | Freq: Every day | ORAL | Status: DC
  Administered 2014-10-18 – 2014-10-20 (×3): 10 mg via ORAL
  Filled 2014-10-18 (×3): qty 2

## 2014-10-18 MED ORDER — TRAMADOL HCL 50 MG PO TABS: 50.0000 mg | ORAL_TABLET | Freq: Four times a day (QID) | ORAL | Status: DC | PRN

## 2014-10-18 MED ORDER — TRAMADOL HCL 50 MG PO TABS
50.0000 mg | ORAL_TABLET | Freq: Four times a day (QID) | ORAL | Status: DC | PRN
  Administered 2014-10-18 – 2014-10-20 (×3): 50 mg via ORAL
  Filled 2014-10-18 (×4): qty 1

## 2014-10-18 MED ORDER — HYDROCODONE-ACETAMINOPHEN 5-325 MG PO TABS: 1.0000 | ORAL_TABLET | Freq: Four times a day (QID) | ORAL | Status: DC | PRN

## 2014-10-18 MED ORDER — HYDROCODONE-ACETAMINOPHEN 5-325 MG PO TABS
1.0000 | ORAL_TABLET | Freq: Four times a day (QID) | ORAL | Status: DC | PRN
  Administered 2014-10-20: 1 via ORAL
  Filled 2014-10-18: qty 1

## 2014-10-18 NOTE — Evaluation (Signed)
Occupational Therapy Evaluation Patient Details Name: Tony [name] MRN: [id number] DOB: Jun 30, 1950 Today's Date: 10/18/2014    History of Present Illness Left RTSA   Clinical Impression   This 64 yo male admitted and underwent above presents to acute OT with decreased use of LUE, decreased balance, decreased mobility, decreased arousal, increased lethargy, and obesity all affecting his ability to care for himself at home. He will benefit from acute OT with follow up HHOT as long as lethargy and mobility improve--otherwise may need SNF.    Follow Up Recommendations  Home health OT    Equipment Recommendations  None recommended by OT       Precautions / Restrictions Precautions Precautions: Fall;Shoulder Shoulder Interventions: Shoulder sling/immobilizer;Off for dressing/bathing/exercises (can have strap undone when sitting up during the day--sleep in brace) Precaution Comments: PROM/AROM FF 0-90 dgrees; PROM/AROM abduction 0-60 degrees; NO internal/external exercises, but can go into those positions with BADLS: AROM elbow-wrist-hand Restrictions Weight Bearing Restrictions: Yes LUE Weight Bearing: Non weight bearing      Mobility Bed Mobility               General bed mobility comments:  Pt up in recliner upon admisson  Transfers Overall transfer level: Needs assistance Equipment used: 1 person hand held assist Transfers: Sit to/from [employer] Sit to Stand: Min assist Stand pivot transfers: Min assist            Balance Overall balance assessment: Needs assistance Sitting-balance support: No upper extremity supported;Feet supported Sitting balance-Leahy Scale: Fair     Standing balance support: Single extremity supported Standing balance-Leahy Scale: Poor                                    Vision Additional Comments: Other than lethargy pt's vision is baseline          Pertinent Vitals/Pain Pain Assessment:  Faces Faces Pain Scale: Hurts a little bit Pain Location: left shoulder Pain Descriptors / Indicators: Sore Pain Intervention(s): Monitored during session;Repositioned     Hand Dominance Right   Extremity/Trunk Assessment Upper Extremity Assessment Upper Extremity Assessment: LUE deficits/detail LUE Deficits / Details: reverse TSA this admissionl; decreased use of elbow and hand as well due to pt's body habitus and lethargy LUE Coordination: decreased fine motor;decreased gross motor           Communication Communication Communication: No difficulties   Cognition Arousal/Alertness: Lethargic (possibly due to pain meds and/or needing 4 liters of O2 to keep sats up around 90) Behavior During Therapy: WFL for tasks assessed/performed Overall Cognitive Status: Impaired/Different from baseline Area of Impairment: Following commands;Safety/judgement (due to lethargy)     Memory: Decreased recall of precautions Following Commands: Follows one step commands with increased time (and increased cues) Safety/Judgement: Decreased awareness of deficits;Decreased awareness of safety               Shoulder Instructions Shoulder Instructions Donning/doffing shirt without moving shoulder: Caregiver independent with task Donning/doffing sling/immobilizer: Caregiver independent with task Correct positioning of sling/immobilizer: Caregiver independent with task Pendulum exercises (written home exercise program):  (NA) Sling wearing schedule (on at all times/off for ADL's): Caregiver independent with task Proper positioning of operated UE when showering:  (did not go over) Dressing change:  (NA) Positioning of UE while sleeping: Caregiver independent with task    Home Living Family/patient expects to be discharged to:: Private residence Living Arrangements: Spouse/significant other Available Help  at Discharge: Family;Available PRN/intermittently Type of Home: House              Bathroom Shower/Tub: Tub/shower unit;Curtain Shower/tub characteristics: Engineer, building servicesCurtain Bathroom Toilet: Standard     Home Equipment:  (lift chair)          Prior Functioning/Environment Level of Independence: Independent             OT Diagnosis: Generalized weakness;Acute pain;Cognitive deficits   OT Problem List: Decreased strength;Decreased range of motion;Impaired balance (sitting and/or standing);Pain;Impaired UE functional use;Obesity;Cardiopulmonary status limiting activity   OT Treatment/Interventions: Self-care/ADL training;Patient/family education;Balance training;DME and/or AE instruction;Therapeutic exercise    OT Goals(Current goals can be found in the care plan section) Acute Rehab OT Goals Patient Stated Goal: wife --have pt moving better before I take him home OT Goal Formulation: With patient/family Time For Goal Achievement: 10/25/14 Potential to Achieve Goals: Good  OT Frequency: Min 3X/week   Barriers to D/C: Decreased caregiver support (wife in and out at home)             End of Session Equipment Utilized During Treatment:  (sling) Nurse Communication:  (spoke to PA (tracy shuford) and she asked me ask the nurse to hold pt's pain meds for now to see if this would help pts lethargy)  Activity Tolerance: Patient limited by lethargy Patient left: in chair;with call bell/phone within reach;with family/visitor present   Time: [id number] OT Time Calculation (min): 14 min Charges:  OT General Charges $OT Visit: 1 Procedure OT Evaluation $Initial OT Evaluation Tier I: 1 Procedure  Evette GeorgesLeonard, Tenya Araque Eva [phone] 10/18/2014, 12:06 PM

## 2014-10-18 NOTE — Care Management Note (Signed)
Case Management Note  Patient Details  Name: Henrietta HooverGeorge R Shimon MRN: [id number] Date of Birth: 1950-11-05  Subjective/Objective:           S/p left reverse shoulder          Action/Plan: PT/OT evals-recommended HHPT and HHOT, cane. Spoke with patient about HH, he selected [facility]. Contacted [facility] at [facility] and set up HHPT and HHOT. Patient states that he has a cane at home and that his wife will be availble to assist after discharge.  Expected Discharge Date:                  Expected Discharge Plan:  Home w Home Health Services  In-House Referral:  NA  Discharge planning Services  CM Consult  Post Acute Care Choice:  Home Health Choice offered to:  Patient  DME Arranged:    DME Agency:     HH Arranged:  PT, OT HH Agency:  [facility]  Status of Service:  In process, will continue to follow  Medicare Important Message Given:    Date Medicare IM Given:    Medicare IM give by:    Date Additional Medicare IM Given:    Additional Medicare Important Message give by:     If discussed at Long Length of Stay Meetings, dates discussed:    Additional Comments:  Monica BectonKrieg, Denora Wysocki Watson, RN 10/18/2014, 2:47 PM

## 2014-10-18 NOTE — Progress Notes (Signed)
Tony HooverGeorge R [name]  MRN: [id number] DOB/Age: 65-Oct-1952 65 y.o. Physician: Jacquelyne BalintKevin Supple,MD Procedure: Procedure(s) (LRB): LEFT REVERSE SHOULDER ARTHROPLASTY (Left)     Subjective: Very sedated today and not able to mobilize effectively. Has not had narcotics since early am at this point.  Vital Signs Temp:  [97.1 F (36.2 C)-99.2 F (37.3 C)] 99.1 F (37.3 C) (07/01 0601) Pulse Rate:  [41-137] 41 (07/01 0601) Resp:  [10-17] 17 (07/01 0601) BP: (90-128)/(54-75) 106/63 mmHg (07/01 0601) SpO2:  [88 %-100 %] 90 % (07/01 0810)  Lab Results No results for input(s): WBC, HGB, HCT, PLT in the last 72 hours. BMET No results for input(s): NA, K, CL, CO2, GLUCOSE, BUN, CREATININE, CALCIUM in the last 72 hours. POC INR  Date Value Ref Range Status  07/06/2010 2.3     INR  Date Value Ref Range Status  10/18/2014 1.24 0.00 - 1.49 Final  10/07/2014 2.3  Final     Exam Left shoulder dressing dry. NVI Nods off to sleep during exam        Plan Will keep overnight to allow him to clear off some narcotics to improve his function and breathing. [facility] Spoke with wife at length who is on board with plan  [facility] IncHUFORD,Dravin Lance for Dr.Kevin Supple 10/18/2014, 12:55 PM Contact # [phone]

## 2014-10-18 NOTE — Evaluation (Signed)
Physical Therapy Evaluation Patient Details Name: Tony HooverGeorge R [name] MRN: [id number] DOB: 31-Aug-1950 Today's Date: 10/18/2014   History of Present Illness  Left RTSA  Clinical Impression  Patient now s/p Lt reverse total shoulder arthroplasty. Current mobility limited with decreased balance, generalized weakness and lethargy. All mobility performed with 4L O2. SpO2 dropping to 86% during ambulation but back to 91% upon return to sitting. Wife present for session and reporting that she is on limited assistance at home due to her own medical issues. Will continue to follow and attempt to mobilize for return home with family assistance.     Follow Up Recommendations  (Home OT or PT for therapy for Reverse TSA)    Equipment Recommendations  Cane    Recommendations for Other Services       Precautions / Restrictions Precautions Precautions: Shoulder;Fall Shoulder Interventions: Shoulder sling/immobilizer;At all times;Off for dressing/bathing/exercises Precaution Comments: PROM/AROM FF 0-90 dgrees; PROM/AROM abduction 0-60 degrees; NO internal/external exercises, but can go into those positions with BADLS: AROM elbow-wrist-hand Required Braces or Orthoses: Sling Restrictions Weight Bearing Restrictions: Yes LUE Weight Bearing: Non weight bearing      Mobility  Bed Mobility               General bed mobility comments:  Pt up in recliner upon admisson  Transfers Overall transfer level: Needs assistance Equipment used: Straight cane Transfers: Sit to/from Stand Sit to Stand: Min assist Stand pivot transfers: Min assist          Ambulation/Gait Ambulation/Gait assistance: Min assist Ambulation Distance (Feet): 25 Feet Assistive device: Straight cane Gait Pattern/deviations: Shuffle   Gait velocity interpretation: Below normal speed for age/gender General Gait Details: seated rest after 10 feet, using 4L O2 during session  Stairs            Wheelchair Mobility     Modified Rankin (Stroke Patients Only)       Balance Overall balance assessment: Needs assistance Sitting-balance support: Single extremity supported Sitting balance-Leahy Scale: Fair     Standing balance support: Single extremity supported Standing balance-Leahy Scale: Poor                               Pertinent Vitals/Pain Pain Assessment: No/denies pain Faces Pain Scale: No hurt    Home Living Family/patient expects to be discharged to:: Private residence Living Arrangements: Spouse/significant other Available Help at Discharge: Family;Available PRN/intermittently Type of Home: House Home Access: Stairs to enter;Ramped entrance (one entrance is ramped, 2 stairs at other)   Entrance Stairs-Number of Steps: 2   Home Equipment: None      Prior Function Level of Independence: Independent         Comments: working as a Hospital doctordriver for a gas truck, planning to return     Hand Dominance   Dominant Hand: Right    Extremity/Trunk Assessment   Upper Extremity Assessment: LUE deficits/detail       LUE Deficits / Details: reverse TSA this admissionl; decreased use of elbow and hand as well due to pt's body habitus and lethargy   Lower Extremity Assessment: Generalized weakness         Communication   Communication: No difficulties  Cognition Arousal/Alertness: Lethargic Behavior During Therapy: WFL for tasks assessed/performed Overall Cognitive Status: Impaired/Different from baseline Area of Impairment: Following commands;Safety/judgement (due to lethargy)     Memory: Decreased recall of precautions Following Commands: Follows one step commands with increased time (and  increased cues) Safety/Judgement: Decreased awareness of deficits;Decreased awareness of safety          General Comments      Exercises Donning/doffing shirt without moving shoulder: Caregiver independent with task Donning/doffing sling/immobilizer: Caregiver  independent with task Correct positioning of sling/immobilizer: Caregiver independent with task Pendulum exercises (written home exercise program):  (NA) Sling wearing schedule (on at all times/off for ADL's): Caregiver independent with task Proper positioning of operated UE when showering:  (did not go over) Dressing change:  (NA) Positioning of UE while sleeping: Caregiver independent with task      Assessment/Plan    PT Assessment Patient needs continued PT services  PT Diagnosis Difficulty walking;Generalized weakness   PT Problem List Decreased strength;Decreased activity tolerance;Decreased balance;Decreased mobility  PT Treatment Interventions Gait training;Stair training;Functional mobility training;Therapeutic activities;Patient/family education   PT Goals (Current goals can be found in the Care Plan section) Acute Rehab PT Goals Patient Stated Goal: Be able to move again and get back to work in late September PT Goal Formulation: With patient Time For Goal Achievement: 11/01/14 Potential to Achieve Goals: Good    Frequency Min 3X/week   Barriers to discharge        Co-evaluation               End of Session Equipment Utilized During Treatment: Gait belt;Oxygen (sling, single point cane) Activity Tolerance: Patient limited by lethargy;Patient limited by fatigue Patient left: in chair;with call bell/phone within reach;with family/visitor present Nurse Communication: Mobility status    Functional Assessment Tool Used: clinical judgment Functional Limitation: Mobility: Walking and moving around Mobility: Walking and Moving Around Current Status (N8295): At least 60 percent but less than 80 percent impaired, limited or restricted Mobility: Walking and Moving Around Goal Status [phone]): At least 40 percent but less than 60 percent impaired, limited or restricted    Time: 1319-1344 PT Time Calculation (min) (ACUTE ONLY): 25 min   Charges:   PT  Evaluation $Initial PT Evaluation Tier I: 1 Procedure PT Treatments $Gait Training: 8-22 mins   PT G Codes:   PT G-Codes **NOT FOR INPATIENT CLASS** Functional Assessment Tool Used: clinical judgment Functional Limitation: Mobility: Walking and moving around Mobility: Walking and Moving Around Current Status (Q6578): At least 60 percent but less than 80 percent impaired, limited or restricted Mobility: Walking and Moving Around Goal Status [phone]): At least 40 percent but less than 60 percent impaired, limited or restricted    Christiane Ha, PT, CSCS Pager [phone] Office [phone]  10/18/2014, 2:12 PM

## 2014-10-18 NOTE — Progress Notes (Signed)
ANTICOAGULATION CONSULT NOTE - Initial Consult  Pharmacy Consult:  Coumadin Indication:  History of AFib + VTE prophylaxis s/p left shoulder arthroplasty  Allergies  Allergen Reactions  . Iodinated Diagnostic Agents Itching and Swelling    Approximately 1996    Patient Measurements: Weight: (!) 347 lb (157.398 kg)  Vital Signs: Temp: 99.1 F (37.3 C) (07/01 0601) Temp Source: Oral (07/01 0601) BP: 106/63 mmHg (07/01 0601) Pulse Rate: 41 (07/01 0601)  Labs:  Recent Labs  10/17/14 0835 10/18/14 0530  APTT 32  --   LABPROT 14.5 15.8*  INR 1.11 1.24    Estimated Creatinine Clearance: 138.1 mL/min (by C-G formula based on Cr of 0.77).   Medical History: Past Medical History  Diagnosis Date  . Hypertension   . Hyperlipidemia   . Atrial fibrillation     unknown onset  . Chronic anticoagulation   . Tobacco abuse   . Degenerative joint disease     Left TKA; right shoulder surgery also advised  . Obesity   . Dysrhythmia   . COPD (chronic obstructive pulmonary disease)   . Shortness of breath dyspnea   . Sleep apnea       Assessment: 63 YOM on Coumadin PTA for history of Afib.  Now to resume s/p left shoulder arthroplasty. INR is currently sub-therapeutic 1.24 after receiving 15mg  last night. No bleeding reported.  PTA dose: Patient was previously therapeutic on home regimen of 10mg  PO daily.  His last dose was on 10/11/14  Goal of Therapy:  INR 2-3  Plan:  - Coumadin 10mg  PO daily per home dose for now - Daily PT / INR  Bayard HuggerMei Abimelec Grochowski, PharmD, BCPS  Clinical Pharmacist  Pager: [phone]    10/18/2014, 10:09 AM

## 2014-10-18 NOTE — Op Note (Signed)
NAME:  Tony, [name] NO.:  [id number]  MEDICAL RECORD NO.:  [id number]  LOCATION:  5N01C                        FACILITY:  [facility]  PHYSICIAN:  Vania Rea. Coy Rochford, M.D.  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  PROCEDURE:  Left reverse shoulder arthroplasty, utilizing a press-fit size 16/1 epi DePuy stem; a +3 polyethylene insert, and a 42 eccentric glenosphere.  SURGEON:  Vania Rea. Lytle Malburg, M.D.  Threasa HeadsFrench Ana A. Shuford, PA-C  ANESTHESIA:  General endotracheal as well as an interscalene block.  ESTIMATED BLOOD LOSS:  350 mL.  DRAINS:  None.  HISTORY:  Tony [name] is a 64 year old gentleman, who has had chronic and progressively increasing bilateral shoulder pain, left more symptomatic than the right with a known left shoulder rotator cuff tear arthropathy. Plain radiographs demonstrating a high riding humeral head and advanced arthrosis.  Due to his ongoing pain and increasing functional limitations and failure to respond to conservative management, he was brought to the operating room at this time for planned left reverse shoulder arthroplasty.  Preoperatively, I counseled Tony [name] regarding treatment options and potential risks versus benefits thereof.  Possible surgical complications were all reviewed including, bleeding, infection, neurovascular injury, persistent pain, loss of motion, anesthetic complication, failure of the implant, and possible need for additional surgery.  He understands and accepts and agrees with our planned procedure.  DESCRIPTION OF PROCEDURE:  After undergoing routine preop evaluation, the patient received prophylactic antibiotics and an interscalene block was established  in the holding area by the Anesthesia Department. Brought to the operating room and placed supine on the operating table, underwent smooth induction of a general endotracheal anesthesia.  Placed in a beach-chair position and appropriately padded and protected.  The left shoulder girdle region was sterilely prepped and draped in standard fashion.  Time-out was called.  An anterior deltopectoral approach to the left shoulder was made through a 12 cm anterior incision.  Skin flaps were elevated and electrocautery was used for hemostasis. Dissection carried deeply.  Deltopectoral interval was identified and developed from proximal to distal blunt dissection.  Cephalic vein taken laterally and electrocautery was used for hemostasis.  The upper centimeter and a half of the pec major tendon was tenotomized to enhance exposure.  The conjoined tendon identified, mobilized, and retracted medially.  Adhesions divided the remnant of the deltoid.  Self-retaining retractors were placed.  At this point, the biceps tendon was then unroofed and tenotomized for later tenodesis.  We then divided the remnants of the subscapularis away from its insertion into the lesser tuberosity, leaving a 1 cm cuff of tissue for later repair.  Free margin of the subscapularis was tagged.  We then divided the capsular tissues from the anteroinferior and inferior aspects of the humeral head, allowing it to be completely delivered through the wound.  The humeral head was completely bare.  We used a rongeur to open the apex of the humeral head and then used hand  reaming of the humeral canal up to size 16.  Of part cutting intramedullary cutting guide, we outlined a proposed humeral head resection and this was then performed at 5 degrees of retroversion with an oscillating saw.  Care taken to protect the residual posterior element of the rotator cuff.  We then removed the osteophytes from the margin of the proximal humerus.  We  placed a metal cap over the cut proximal surface and then exposed the glenoid with combination of Fukuda, pitchfork, and snake tongue retractors.  We performed a circumferential labral excision.  Capsular release to expose the periphery of the glenoid.  A guide pin was then directed into the center of the glenoid, positioning of glenosphere over the inferior portion of the glenoid with good bony coverage, and this was then reamed with the appropriate reamer down to the subchondral bony bed.  Then, used the peripheral reamer and removed all residual soft tissue at the margins of our reamed glenoid.  A central drill hole was placed and the glenoid base plate was then impacted into position.  It was then transfixed with a series of locking screws with the inferior and superior screws, and anterior and posterior nonlocking, all have obtaining good bony purchase and fixation.  We then placed a 42 eccentric glenosphere over the glenoid base plate and this was tightened and sequentially impacted and re-tightened, obtaining excellent fixation.  At this point, we then returned our attention to the proximal humerus, where we placed the appropriate size 16 stem for reaming of the metaphysis, we used the +1 epi reamer, obtaining good margin of cancellous bone circumferentially.  We then placed our trial humeral stem with reduction of the shoulder showing good soft tissue balance and good position and stability of the implants.  The trial stem was then removed and we placed a FiberWire through the drill hole in the proximal humeral cortex and then impacted the final size 16/1 epi humeral stem, again it popped to 5 degrees of retroversion.  This was seated to appropriate depth and then the +3 poly was impacted and the final reduction performed showing excellent shoulder motion.  Good stability and good soft tissue balance.  At this point, the subscapularis was then mobilized and then repaired back to  the proximal humerus.  There is a combination of our FiberWires and bone tunnels as well as the residual portion of the subscap tendon retained to the lesser tuberosity.  The wound was then copiously irrigated.  Hemostasis was obtained.  The biceps tendon was then tenodesed at the upper margin of the pec major and the residual proximal portion was removed.  The deltopectoral interval was then reapproximated with a series of figure-of-eight #1 Vicryl sutures. 2-0 Vicryl used for the subcu layer and intracuticular 3- 0 Monocryl for the skin, followed by Dermabond and Aquacel dressing. Left arm was placed in a sling.  The patient was awakened, extubated, and taken to recovery room in stable condition.  Tracy A. Shuford, PA-C, was used as an Geophysicist/field seismologistassistant throughout this case, essential for help, positioning of the patient, considering his morbid obesity BMI of 52, retraction of soft tissues, manipulation of the extremity, implantation of the prosthesis, wound closure, and intraoperative decision making.     Vania ReaKevin M. Chayil Gantt, M.D.     KMS/MEDQ  D:  10/17/2014  T:  10/18/2014  Job:  [id number]

## 2014-10-19 DIAGNOSIS — M75102 Unspecified rotator cuff tear or rupture of left shoulder, not specified as traumatic: Secondary | ICD-10-CM | POA: Diagnosis not present

## 2014-10-19 LAB — PROTIME-INR
INR: 1.37 (ref 0.00–1.49)
Prothrombin Time: 17 seconds — ABNORMAL HIGH (ref 11.6–15.2)

## 2014-10-19 NOTE — Progress Notes (Signed)
Updated Dr. Charlann Boxerlin r/t OT and PT recommendation patient not  regarding requires 2 person assist for OOB mobility and transfer to the bed side chair. Stated has not received narcotic for pain management - only Tylenol and Ultram. O2 SATs remain 92-95 with O2 N/C @ 3L/ min. Dr Charlann Boxerlin instructed to not use Narcotic to manage pain. Stated has only received Tramadol or Tylenol to manage shoulder pain. Of note his last dose of Oxy/ narcotic given 10/18/14 @ 0603.

## 2014-10-19 NOTE — Progress Notes (Signed)
Physical Therapy Treatment Patient Details Name: Tony HooverGeorge R [name] MRN: [id number] DOB: April 17, 1951 Today's Date: 10/19/2014    History of Present Illness Left R TSA.    PT Comments    Patient continues to be lethargic with difficulty staying awake during session. Very unsteady on feet even with use of SPC requiring hands on assist for safety. Pt continues to have decrease in oxygen saturation with mobility to 86% on 5l/min 02. Wife is not able to physically assist pt at home and pt requires Min-Mod A for bed mobility, transfers and ambulation. Pt would benefit from another night in hospital to see if lethargy and mobility improve prior to d/c or pt may need ST SNF.   Follow Up Recommendations  Home health PT;Supervision/Assistance - 24 hour (if lethargy and mobility improve. )     Equipment Recommendations  Cane    Recommendations for Other Services       Precautions / Restrictions Precautions Precautions: Shoulder;Fall Shoulder Interventions: Shoulder sling/immobilizer;At all times;Off for dressing/bathing/exercises Precaution Comments: PROM/AROM FF 0-90 dgrees; PROM/AROM abduction 0-60 degrees; NO internal/external exercises, but can go into those positions with BADLS: AROM elbow-wrist-hand Required Braces or Orthoses: Sling Restrictions Weight Bearing Restrictions: Yes LUE Weight Bearing: Non weight bearing    Mobility  Bed Mobility Overal bed mobility: Needs Assistance Bed Mobility: Supine to Sit     Supine to sit: HOB elevated;Mod assist     General bed mobility comments: Mod A to elevate trunk and get to EOB. Lethargic. Sa02 86% on 5L/min upon sitting EOB.  Transfers Overall transfer level: Needs assistance Equipment used: Straight cane Transfers: Sit to/from Stand Sit to Stand: Min assist         General transfer comment: Min A to boost up from EOB. Posterior lean. Transferred to chair.  Ambulation/Gait Ambulation/Gait assistance: Min assist Ambulation  Distance (Feet): 40 Feet Assistive device: Straight cane Gait Pattern/deviations: Wide base of support;Shuffle;Step-through pattern   Gait velocity interpretation: Below normal speed for age/gender General Gait Details: Very unsteady during gait. Min A for balance/safety as pt lethargic. Pt on 6L/min 02. Cues for pursed lip breathing.    Stairs            Wheelchair Mobility    Modified Rankin (Stroke Patients Only)       Balance Overall balance assessment: Needs assistance Sitting-balance support: Feet supported;Bilateral upper extremity supported Sitting balance-Leahy Scale: Fair Sitting balance - Comments: Lethargic requiring Min guard for safety sitting EOB. Sa02 dropped requiring cues for pursed lip breathing. Postural control: Posterior lean Standing balance support: During functional activity Standing balance-Leahy Scale: Poor Standing balance comment: Min-Mod A during urination as pt with posterior lean and lethargy.                    Cognition Arousal/Alertness: Lethargic Behavior During Therapy: Flat affect Overall Cognitive Status: Impaired/Different from baseline Area of Impairment: Following commands;Safety/judgement     Memory: Decreased recall of precautions Following Commands: Follows one step commands with increased time Safety/Judgement: Decreased awareness of deficits;Decreased awareness of safety     General Comments: Pt continues to be lethargic. Requires repetition of cues to perform tasks. Falling asleep during session if not stimulated.     Exercises      General Comments General comments (skin integrity, edema, etc.): Wife concerned about pt returning home today as she cannot assist.      Pertinent Vitals/Pain Pain Assessment: Faces Faces Pain Scale: Hurts a little bit Pain Location: left shoulder with mobility.  Pain Descriptors / Indicators: Sore Pain Intervention(s): Monitored during session;Repositioned    Home Living                       Prior Function            PT Goals (current goals can now be found in the care plan section) Progress towards PT goals: Not progressing toward goals - comment (secondary to lethargy, drop in 02 sats)    Frequency  Min 3X/week    PT Plan Current plan remains appropriate    Co-evaluation             End of Session Equipment Utilized During Treatment: Gait belt;Oxygen Activity Tolerance: Patient limited by lethargy Patient left: in chair;with call bell/phone within reach;with family/visitor present     Time: 1100-1115 PT Time Calculation (min) (ACUTE ONLY): 15 min  Charges:  $Gait Training: 8-22 mins                    G Codes:      Tony [name] 10/19/2014, 12:00 PM Tony [name], PT, DPT [phone]

## 2014-10-19 NOTE — Progress Notes (Signed)
Occupational Therapy Treatment Patient Details Name: Tony [name] MRN: [id number] DOB: [birth year] Today's Date: 10/19/2014    History of present illness Left reverse TSA.   OT comments  Pt lethargic in session. OT woke up pt's wife for session for education. Pt may need SNF depending on progress. Will plan to have another session tomorrow.   Follow Up Recommendations  Home health OT;Supervision/Assistance - 24 hour    Equipment Recommendations  None recommended by OT    Recommendations for Other Services      Precautions / Restrictions Precautions Precautions: Shoulder;Fall Type of Shoulder Precautions: no pushing, pulling, lifiting (can use to hold light items); reviewed shoulder precautions Shoulder Interventions: Shoulder sling/immobilizer;Off for dressing/bathing/exercises;At all times (sleep in sling; can come out when sitting in controlled environment to give neck a break) Precaution Comments: PROM/AROM FF 0-90 degrees; PROM/AROM abduction 0-60 degrees; NO internal/external exercises, but can go into those positions with BADLS: AROM elbow-wrist-hand Required Braces or Orthoses: Sling Restrictions Weight Bearing Restrictions: Yes LUE Weight Bearing: Non weight bearing       Mobility Bed Mobility Pt in chair   Transfers    General transfer comment: not assessed    Balance Pt in chair. Balance not formally assessed.            ADL Overall ADL's : Needs assistance/impaired                                       General ADL Comments: see shoulder section. Instructed to not wear scented deodorant under left arm.      Vision                     Perception     Praxis      Cognition   Behavior During Therapy: Flat affect Overall Cognitive Status: Impaired/Different from baseline Area of Impairment: Following commands      Following Commands: Follows one step commands with increased time   General Comments: Pt continues to be  lethargic.     Extremity/Trunk Assessment               Exercises Other Exercises Other Exercises: Pt performed AAROM left elbow flexion/extension; moved left digits actively; AROM left wrist flexion/extension    Shoulder Instructions Shoulder Instructions Donning/doffing shirt without moving shoulder:  (Educated on technique/discussed clothing) Method for sponge bathing under operated UE:  (educated) Donning/doffing sling/immobilizer:  (OT unfastened strap to perform elbow ROM and refastened it/adjusted; OT performed all management of sling) Correct positioning of sling/immobilizer:  (educated/demonstrated) ROM for elbow, wrist and digits of operated UE: Minimal assistance Sling wearing schedule (on at all times/off for ADL's):  (OT educated) Proper positioning of operated UE when showering:  (OT educated) Positioning of UE while sleeping:  (educated on positioning of pillow)     General Comments      Pertinent Vitals/ Pain       Pain Assessment: 0-10 Pain Score: 0-No pain   Home Living                                          Prior Functioning/Environment              Frequency Min 3X/week     Progress Toward Goals  OT Goals(current goals can now be  found in the care plan section)  Progress towards OT goals: Progressing toward goals  Acute Rehab OT Goals Patient Stated Goal: not stated OT Goal Formulation: With patient/family Time For Goal Achievement: 10/25/14 Potential to Achieve Goals: Good ADL Goals Pt Will Perform Upper Body Bathing: with mod assist;sitting;standing;with caregiver independent in assisting Pt Will Transfer to Toilet: with supervision;ambulating (toilet in bathroom) Pt Will Perform Toileting - Clothing Manipulation and hygiene: with min assist;sit to/from stand Pt Will Perform Tub/Shower Transfer: Tub transfer;with min guard assist;shower seat Pt/caregiver will Perform Home Exercise Program: Increased ROM;Increased  strength;Left upper extremity;With written HEP provided;Independently  Plan Discharge plan needs to be updated    Co-evaluation                 End of Session Equipment Utilized During Treatment: Other (comment);Oxygen (sling)   Activity Tolerance Patient limited by lethargy   Patient Left in chair;with call bell/phone within reach;with family/visitor present   Nurse Communication Other (comment) (asking about d/c)        Time: [id number] OT Time Calculation (min): 18 min  Charges: OT General Charges $OT Visit: 1 Procedure OT Treatments $Self Care/Home Management : 8-22 mins  Earlie RavelingStraub, Risa Auman L OTR/L [phone] 10/19/2014, 12:29 PM

## 2014-10-19 NOTE — Progress Notes (Signed)
Patient used 3L of oxygen during the night, when nasal cannula would come out of nares, patient's O2 sats would drop to mid 80's.  Then after cannula placed back on, his O2 sat's would rise to mid 90's.  Patient has remained lethargic but arousable and answers appropriately and will let his needs be known.

## 2014-10-19 NOTE — Progress Notes (Signed)
Patient ID: Tony HooverGeorge R [name], male   DOB: 1950/12/01, 64 y.o.   MRN: [id number] Subjective: 2 Days Post-Op Procedure(s) (LRB): LEFT REVERSE SHOULDER ARTHROPLASTY (Left)    Patient reports pain as moderate.  Still a bit groggy.  O2 sats dropping with OOB activity.  Has not not seen OT yet per wife  Objective:   VITALS:   Filed Vitals:   10/19/14 0544  BP: 95/65  Pulse: 73  Temp: 97.5 F (36.4 C)  Resp:     Neurovascular intact Incision: dressing C/D/I  Sleeping during conversation with his wife [name] oxygen on  LABS No results for input(s): HGB, HCT, WBC, PLT in the last 72 hours.  No results for input(s): NA, K, BUN, CREATININE, GLUCOSE in the last 72 hours.   Recent Labs  10/18/14 0530 10/19/14 0424  INR 1.24 1.37     Assessment/Plan: 2 Days Post-Op Procedure(s) (LRB): LEFT REVERSE SHOULDER ARTHROPLASTY (Left)   Up with therapy Discharge home with home health   At this point will try to get him home with HHPT/OT and nursing for O2 maintaining sats in the 90s with follow up with his primary care physician

## 2014-10-20 DIAGNOSIS — M75102 Unspecified rotator cuff tear or rupture of left shoulder, not specified as traumatic: Secondary | ICD-10-CM | POA: Diagnosis not present

## 2014-10-20 LAB — PROTIME-INR
INR: 1.27 (ref 0.00–1.49)
PROTHROMBIN TIME: 16 s — AB (ref 11.6–15.2)

## 2014-10-20 NOTE — Progress Notes (Signed)
Pt is experiencing more shoulder pain this evening. States ultram did not relieve his pain. Norco did provide relief. Pt is constipated. Last BM on 6/28. Abdomen is distended, although soft. Bowel sounds present in all four quadrants. Miralax and colace given this evening. Pt ambulated in hall with minimal assistance. He is insistent that he be discharged to home on Monday.

## 2014-10-20 NOTE — Progress Notes (Signed)
ANTICOAGULATION CONSULT NOTE  Pharmacy Consult:  Coumadin Indication:  History of AFib + VTE prophylaxis s/p left shoulder arthroplasty  Allergies  Allergen Reactions  . Iodinated Diagnostic Agents Itching and Swelling    Approximately 1996    Patient Measurements: Weight: (!) 347 lb (157.398 kg)  Vital Signs: Temp: 98.9 F (37.2 C) (07/03 0656) Temp Source: Oral (07/03 0656) BP: 114/57 mmHg (07/03 0656) Pulse Rate: 85 (07/03 0656)  Labs:  Recent Labs  10/18/14 0530 10/19/14 0424 10/20/14 0632  LABPROT 15.8* 17.0* 16.0*  INR 1.24 1.37 1.27    Estimated Creatinine Clearance: 138.1 mL/min (by C-G formula based on Cr of 0.77).   Assessment: 63 YOM on Coumadin PTA for history of Afib.  Coumadin resumed s/p left shoulder arthroplasty. INR is currently sub-therapeutic 1.27 after receiving 3 doses of coumadin (15- 10- 10). No bleeding reported.  PTA dose: Patient was previously therapeutic on home regimen of 10mg  PO daily.  His last dose PTA was on 10/11/14  Goal of Therapy:  INR 2-3  Plan:  - Coumadin 10mg  PO daily per home dose  - Daily PT / INR - plan to discharge home on home dose of 10 mg daily with f/u at outpt coumadin clinic.  Herby AbrahamMichelle T. Kamya Watling, Pharm.D. [phone] 10/20/2014 9:48 AM

## 2014-10-20 NOTE — Progress Notes (Signed)
Physical Therapy Treatment Patient Details Name: Tony [name] MRN: [id number] DOB: 1950/09/09 Today's Date: 10/20/2014    History of Present Illness Left reverse TSA.    PT Comments    Pt not oriented to time, unable to recall the year and very lethargic again this session.  Pt's SpO2 above 90% during session on RA.  90% sitting EOB, 90-94% ambulating, 90% sitting in chair at end of session.  [place] reapplied at end of session.  Unless cognitive status improves pt will need ST SNF upon d/c as pt lives alone w/ wife who recenlty had back surgery and will not be able to assist pt.  Pt will benefit from continued skilled PT services to increase functional independence and safety.   Follow Up Recommendations  SNF;Home health PT;Supervision/Assistance - 24 hour (If going home will need HHPT)     Equipment Recommendations  Cane    Recommendations for Other Services       Precautions / Restrictions Precautions Precautions: Shoulder;Fall Type of Shoulder Precautions: no pushing, pulling, lifiting (can use to hold light items); reviewed shoulder precautions Shoulder Interventions: Shoulder sling/immobilizer;Off for dressing/bathing/exercises;At all times (sling can come off when sitting in controlled environment) Precaution Comments: PROM/AROM FF 0-90 degrees; PROM/AROM abduction 0-60 degrees; NO internal/external exercises, but can go into those positions with BADLS: AROM elbow-wrist-hand Required Braces or Orthoses: Sling Restrictions Weight Bearing Restrictions: Yes LUE Weight Bearing: Non weight bearing    Mobility  Bed Mobility Overal bed mobility: Needs Assistance Bed Mobility: Supine to Sit     Supine to sit: HOB elevated;Mod assist     General bed mobility comments: HOB elevated and use of bed rails to achieve sitting EOB, min use of bed pad and VCs and TCs for positioning.  Transfers Overall transfer level: Needs assistance Equipment used: Straight cane Transfers: Sit  to/from Stand Sit to Stand: Min assist;+2 safety/equipment         General transfer comment: Min assist to maintain balance upon standing.  Pt required multiple VCs to stand before pt performed and required directional cues when performing stand>sit.  Ambulation/Gait Ambulation/Gait assistance: Min assist;+2 safety/equipment Ambulation Distance (Feet): 80 Feet Assistive device: Straight cane Gait Pattern/deviations: Step-through pattern;Decreased stride length;Antalgic;Staggering left;Staggering right;Drifts right/left;Narrow base of support   Gait velocity interpretation: Below normal speed for age/gender General Gait Details: Very unsteady during gait and staggering L and R.  Pt requires VCs to avoid walking LUE into door and still requires increased time after VCs for processing.  Pt carrying cane but not utilizing it, dragging it with him despite VCs for technique.     Stairs            Wheelchair Mobility    Modified Rankin (Stroke Patients Only)       Balance Overall balance assessment: Needs assistance Sitting-balance support: No upper extremity supported;Feet supported Sitting balance-Leahy Scale: Fair Sitting balance - Comments: Lethargic requiring Min guard for safety sitting EOB.   Standing balance support: Single extremity supported;During functional activity Standing balance-Leahy Scale: Poor                      Cognition Arousal/Alertness: Lethargic;Suspect due to medications Behavior During Therapy: Flat affect Overall Cognitive Status: Impaired/Different from baseline Area of Impairment: Following commands;Safety/judgement;Memory;Attention;Orientation;Awareness;Problem solving Orientation Level: Disoriented to;Time (Unable to verbalize the year) Current Attention Level: Sustained Memory: Decreased short-term memory Following Commands: Follows one step commands with increased time;Follows one step commands inconsistently Safety/Judgement:  Decreased awareness of safety;Decreased awareness of  deficits Awareness: Intellectual Problem Solving: Slow processing;Difficulty sequencing;Requires verbal cues;Requires tactile cues General Comments: Pt very lethargic and at times has eyes closed during session.  Pt unable to recall the year.  During session when PT in front of pt asking pt questions pt w/ glassed over looking straight ahead down hallway.    Exercises Shoulder Exercises Shoulder Flexion: PROM;Left;10 reps;Seated (lap slides) Shoulder Extension: PROM;Left;10 reps;Seated (lap slides) Elbow Flexion: AAROM;Left;10 reps;Seated Elbow Extension: AAROM;Left;10 reps;Seated Wrist Flexion: AAROM;Left;10 reps;Seated Wrist Extension: AAROM;10 reps;Left;Seated Digit Composite Flexion: AAROM;Left;10 reps;Seated Composite Extension: AAROM;Left;10 reps;Seated Donning/doffing shirt without moving shoulder:  (handout given, education provided to wife - pt too lethargic) Method for sponge bathing under operated UE:  (handout given, education provided to wife - pt too lethargic) Donning/doffing sling/immobilizer:  (handout given, education provided to wife - pt too lethargic) Correct positioning of sling/immobilizer:  (handout given, education provided to wife - pt too lethargic) ROM for elbow, wrist and digits of operated UE: Minimal assistance Sling wearing schedule (on at all times/off for ADL's):  (handout given, education provided to wife - pt too lethargic) Proper positioning of operated UE when showering:  (handout given, education provided to wife - pt too lethargic) Dressing change:  (n/a) Positioning of UE while sleeping:  (handout given, education provided to wife - pt too lethargic)    General Comments General comments (skin integrity, edema, etc.): Pt's SpO2 above 90% during session on RA.  90% sitting EOB, 90-94% ambulating, 90% sitting in chair at end of session.  [place] reapplied at end of session.  Unless cognitive status  improves pt will need ST SNF upon d/c as pt lives alone w/ wife who recenlty had back surgery and will not be able to assist pt.      Pertinent Vitals/Pain Pain Assessment: Faces Faces Pain Scale: Hurts a little bit Pain Location: L shoulder Pain Descriptors / Indicators: Grimacing Pain Intervention(s): Limited activity within patient's tolerance;Monitored during session;Repositioned    Home Living                      Prior Function            PT Goals (current goals can now be found in the care plan section) Acute Rehab PT Goals Patient Stated Goal: not stated PT Goal Formulation: With family Time For Goal Achievement: 11/01/14 Potential to Achieve Goals: Good Progress towards PT goals: Not progressing toward goals - comment (limited by pt's altered cognitive status)    Frequency  Min 3X/week    PT Plan Discharge plan needs to be updated    Co-evaluation             End of Session Equipment Utilized During Treatment: Gait belt;Oxygen Activity Tolerance: Patient limited by lethargy;Treatment limited secondary to medical complications (Comment) (altered cognitive status) Patient left: in chair;with call bell/phone within reach;with family/visitor present     Time: 1610-9604 PT Time Calculation (min) (ACUTE ONLY): 34 min  Charges:  $Gait Training: 23-37 mins                    G Codes:      Michail Jewels PT, [place] [phone] Pager: [phone] 10/20/2014, 12:17 PM

## 2014-10-20 NOTE — Progress Notes (Signed)
Patient ID: Tony [name], male   DOB: 1951/03/08, 64 y.o.   MRN: [id number] Subjective: 3 Days Post-Op Procedure(s) (LRB): LEFT REVERSE SHOULDER ARTHROPLASTY (Left)    Patient reports pain as mild.  Wife says he's less sedated.  O2 sats improving but still requiring supplemental O2.    Objective:   VITALS:   Filed Vitals:   10/20/14 0656  BP: 114/57  Pulse: 85  Temp: 98.9 F (37.2 C)  Resp: 18   Alert, NAD Neurovascular intact Incision: dressing C/D/I  [place] oxygen on  LABS No results for input(s): HGB, HCT, WBC, PLT in the last 72 hours.  No results for input(s): NA, K, BUN, CREATININE, GLUCOSE in the last 72 hours.   Recent Labs  10/19/14 0424 10/20/14 0632  INR 1.37 1.27     Assessment/Plan: 3 Days Post-Op Procedure(s) (LRB): LEFT REVERSE SHOULDER ARTHROPLASTY (Left)   Up with therapy Discharge home with home health   At this point will try to get him home with HHPT/OT and nursing for O2 maintaining sats in the 90s with follow up with his primary care physician

## 2014-10-20 NOTE — Progress Notes (Signed)
Occupational Therapy Treatment Patient Details Name: Tony [name] MRN: [id number] DOB: Jan 06, 1951 Today's Date: 10/20/2014    History of present illness Left reverse TSA.   OT comments  Patient making slow progress towards goals, d/c plan updated -> ST-SNF. Pt with lethargy and no evidence of learning regarding education taught for shoulder protocol. Pt's wife present, but states she will be unable to assist with patient in this state due to her recent back surgery. IF unable to go to ST-SNF, recommending HHOT and 24/7 supervision/assistance. Pt with either blank stare or eyes shut during session. Pt would talk with max prompting cues 75% of the time, 25 % of the time patient would not answer at all. RN aware of this.    Follow Up Recommendations  Supervision/Assistance - 24 hour;SNF    Equipment Recommendations  None recommended by OT    Recommendations for Other Services  None at this time   Precautions / Restrictions Precautions Precautions: Shoulder;Fall Type of Shoulder Precautions: no pushing, pulling, lifiting (can use to hold light items); reviewed shoulder precautions Shoulder Interventions: Shoulder sling/immobilizer;Off for dressing/bathing/exercises;At all times (sling can come off when sitting in controlled environment) Precaution Comments: PROM/AROM FF 0-90 degrees; PROM/AROM abduction 0-60 degrees; NO internal/external exercises, but can go into those positions with BADLS: AROM elbow-wrist-hand Required Braces or Orthoses: Sling Restrictions Weight Bearing Restrictions: Yes LUE Weight Bearing: Non weight bearing    Mobility Bed Mobility General bed mobility comments: Pt received seated in recliner upon OT entering/exiting room  Transfers General transfer comment: not assessed    Balance Overall balance assessment: Needs assistance Sitting-balance support: Single extremity supported;Feet supported Sitting balance-Leahy Scale: Fair   ADL Overall ADL's : Needs  assistance/impaired General ADL Comments: see shoulder section. Instructed on see saw method for UB bathing. Pt with poor carryover and unable to follow one-step commands consistently.      Vision Additional Comments: other than lethargy, patient's vision is baseline          Cognition   Behavior During Therapy: Flat affect Overall Cognitive Status: Impaired/Different from baseline Area of Impairment: Following commands;Safety/judgement;Memory;Attention;Orientation;Awareness Orientation Level: Disoriented to;Place   Memory: Decreased recall of precautions  Following Commands: Follows one step commands with increased time;Follows one step commands inconsistently Safety/Judgement: Decreased awareness of deficits;Decreased awareness of safety Awareness: Intellectual   General Comments: Pt continues to be lethargic. Requires repetition of cues to perform tasks. Falling asleep during session if not stimulated. Barely able to keep eyes open. Unable to follow one-step commands during LUE exercises; max cueing needed.       Exercises Shoulder Exercises Shoulder Flexion: PROM;Left;10 reps;Seated (lap slides) Shoulder Extension: PROM;Left;10 reps;Seated (lap slides) Elbow Flexion: AAROM;Left;10 reps;Seated Elbow Extension: AAROM;Left;10 reps;Seated Wrist Flexion: AAROM;Left;10 reps;Seated Wrist Extension: AAROM;10 reps;Left;Seated Digit Composite Flexion: AAROM;Left;10 reps;Seated Composite Extension: AAROM;Left;10 reps;Seated Donning/doffing shirt without moving shoulder:  (handout given, education provided to wife - pt too lethargic) Method for sponge bathing under operated UE:  (handout given, education provided to wife - pt too lethargic) Donning/doffing sling/immobilizer:  (handout given, education provided to wife - pt too lethargic) Correct positioning of sling/immobilizer:  (handout given, education provided to wife - pt too lethargic) ROM for elbow, wrist and digits of operated  UE: Minimal assistance Sling wearing schedule (on at all times/off for ADL's):  (handout given, education provided to wife - pt too lethargic) Proper positioning of operated UE when showering:  (handout given, education provided to wife - pt too lethargic) Dressing change:  (n/a) Positioning of  UE while sleeping:  (handout given, education provided to wife - pt too lethargic)   Shoulder Instructions Shoulder Instructions Donning/doffing shirt without moving shoulder:  (handout given, education provided to wife - pt too lethargic) Method for sponge bathing under operated UE:  (handout given, education provided to wife - pt too lethargic) Donning/doffing sling/immobilizer:  (handout given, education provided to wife - pt too lethargic) Correct positioning of sling/immobilizer:  (handout given, education provided to wife - pt too lethargic) ROM for elbow, wrist and digits of operated UE: Minimal assistance Sling wearing schedule (on at all times/off for ADL's):  (handout given, education provided to wife - pt too lethargic) Proper positioning of operated UE when showering:  (handout given, education provided to wife - pt too lethargic) Dressing change:  (n/a) Positioning of UE while sleeping:  (handout given, education provided to wife - pt too lethargic)          Pertinent Vitals/ Pain       Pain Assessment: Faces Faces Pain Scale: Hurts little more Pain Location: left shoulder with mobility and ordered exercises  Pain Descriptors / Indicators: Grimacing ("yes" when asked if having pain, no rate given) Pain Intervention(s): Monitored during session;Repositioned   Frequency Min 3X/week     Progress Toward Goals  OT Goals(current goals can now befound in the care plan section)  Progress towards OT goals: Progressing toward goals     Plan Discharge plan needs to be updated    End of Session Equipment Utilized During Treatment: Other (comment);Oxygen (sling)   Activity Tolerance  Patient limited by lethargy   Patient Left in chair;with call bell/phone within reach;with family/visitor present  Nurse Communication Other (comment) (asking about d/c)    Time: 1018-1050 OT Time Calculation (min): 32 min  Charges: OT General Charges $OT Visit: 1 Procedure OT Treatments $Self Care/Home Management : 8-22 mins $Therapeutic Exercise: 8-22 mins  Brenda Cowher , MS, OTR/L, CLT Pager: [phone]  10/20/2014, 11:08 AM

## 2014-10-21 DIAGNOSIS — M75102 Unspecified rotator cuff tear or rupture of left shoulder, not specified as traumatic: Secondary | ICD-10-CM | POA: Diagnosis not present

## 2014-10-21 LAB — PROTIME-INR
INR: 1.3 (ref 0.00–1.49)
Prothrombin Time: 16.3 seconds — ABNORMAL HIGH (ref 11.6–15.2)

## 2014-10-21 NOTE — Progress Notes (Signed)
Physical Therapy Treatment Patient Details Name: Tony [name] MRN: [id number] DOB: 20-Sep-1950 Today's Date: 10/21/2014    History of Present Illness Left reverse TSA.    PT Comments    Pt w/ improved cognition and A&O x4 this morning.  Pt completed stair training w/ mild instability using cane and ambulated in hallway 50 ft using SPC.  Pt's SpO2 remained above 94% throughout session.  Pt will benefit from continued skilled PT services to increase functional independence and safety.   Follow Up Recommendations  Home health PT;Supervision for mobility/OOB     Equipment Recommendations  Cane    Recommendations for Other Services       Precautions / Restrictions Precautions Precautions: Shoulder;Fall Type of Shoulder Precautions: no pushing, pulling, lifiting (can use to hold light items); reviewed shoulder precautions Shoulder Interventions: Shoulder sling/immobilizer;Off for dressing/bathing/exercises;At all times (sling can come off when sitting in controlled environment) Precaution Comments: PROM/AROM FF 0-90 degrees; PROM/AROM abduction 0-60 degrees; NO internal/external exercises, but can go into those positions with BADLS: AROM elbow-wrist-hand Required Braces or Orthoses: Sling Restrictions Weight Bearing Restrictions: Yes LUE Weight Bearing: Non weight bearing    Mobility  Bed Mobility               General bed mobility comments: in recliner  Transfers Overall transfer level: Needs assistance Equipment used: Straight cane Transfers: Sit to/from Stand Sit to Stand: Supervision         General transfer comment: Supervision for safety.  Pt pushes through cane to achieve standing upright.    Ambulation/Gait Ambulation/Gait assistance: Min guard Ambulation Distance (Feet): 50 Feet Assistive device: Straight cane Gait Pattern/deviations: Step-through pattern;Antalgic;Narrow base of support   Gait velocity interpretation: Below normal speed for  age/gender General Gait Details: Min guard for safety 2/2 pt's narrow BOS and previous unsteadiness.     Stairs Stairs: Yes Stairs assistance: Min guard Stair Management: No rails;Forwards;With cane;Step to pattern Number of Stairs: 2 General stair comments: VCs for proper technique using SPC.  Pt impulsive but follows commands consistently.  Pt w/ slight unsteadiness but able to maintain balance.  Wheelchair Mobility    Modified Rankin (Stroke Patients Only)       Balance Overall balance assessment: Needs assistance Sitting-balance support: No upper extremity supported;Feet supported Sitting balance-Leahy Scale: Fair     Standing balance support: Single extremity supported;During functional activity Standing balance-Leahy Scale: Fair                      Cognition Arousal/Alertness: Awake/alert Behavior During Therapy: Flat affect Overall Cognitive Status: Within Functional Limits for tasks assessed                 General Comments: Pt A&O x4 this morning.  Is able to follow commands consistently and protect LUE while ambulating w/o VCs.    Exercises      General Comments General comments (skin integrity, edema, etc.): pt's SpO2 remained above 94% on RA throughout session.      Pertinent Vitals/Pain Pain Assessment: Faces Faces Pain Scale: Hurts a little bit Pain Location: L shoulder Pain Descriptors / Indicators: Grimacing Pain Intervention(s): Limited activity within patient's tolerance;Monitored during session;Repositioned    Home Living                      Prior Function            PT Goals (current goals can now be found in the care plan section) Acute  Rehab PT Goals Patient Stated Goal: to go home today PT Goal Formulation: With patient/family Time For Goal Achievement: 11/01/14 Potential to Achieve Goals: Good Progress towards PT goals: Progressing toward goals    Frequency  Min 3X/week    PT Plan Discharge plan  needs to be updated    Co-evaluation             End of Session Equipment Utilized During Treatment: Gait belt Activity Tolerance: Patient tolerated treatment well Patient left: in chair;with call bell/phone within reach;with family/visitor present     Time: 1030-1050 PT Time Calculation (min) (ACUTE ONLY): 20 min  Charges:  $Gait Training: 8-22 mins                    G Codes:      Michail JewelsAshley Parr PT, [place] [phone] Pager: [phone] 10/21/2014, 11:33 AM

## 2014-10-21 NOTE — Progress Notes (Signed)
    Subjective: 4 Days Post-Op Procedure(s) (LRB): LEFT REVERSE SHOULDER ARTHROPLASTY (Left) Patient reports pain as 3 on 0-10 scale.   Denies CP or SOB.  Voiding without difficulty. Positive flatus. Objective: Vital signs in last 24 hours: Temp:  [97.9 F (36.6 C)-98.4 F (36.9 C)] 97.9 F (36.6 C) (07/04 0513) Pulse Rate:  [62-73] 73 (07/04 0513) Resp:  [18] 18 (07/04 0513) BP: (102-113)/(57-65) 110/57 mmHg (07/04 0513) SpO2:  [85 %-100 %] 96 % (07/04 0513) Weight:  [157 kg (346 lb 2 oz)] 157 kg (346 lb 2 oz) (07/04 0000)  Intake/Output from previous day: 07/03 0701 - 07/04 0700 In: 680 [P.O.:680] Out: -  Intake/Output this shift:    Labs: No results for input(s): HGB in the last 72 hours. No results for input(s): WBC, RBC, HCT, PLT in the last 72 hours. No results for input(s): NA, K, CL, CO2, BUN, CREATININE, GLUCOSE, CALCIUM in the last 72 hours.  Recent Labs  10/20/14 0632 10/21/14 0356  INR 1.27 1.30    Physical Exam: Neurologically intact Intact pulses distally Incision: dressing C/D/I Compartment soft A+O X3  Assessment/Plan: 4 Days Post-Op Procedure(s) (LRB): LEFT REVERSE SHOULDER ARTHROPLASTY (Left) Up with therapy Patient still with poor mobility PT/OT re-eval today for home D/C    Tony [name] D for Dr. Venita Lickahari [facility] [name] [facility] [phone] 10/21/2014, 8:31 AM

## 2014-10-21 NOTE — Care Management Note (Signed)
Case Management Note  Patient Details  Name: Tony HooverGeorge R [name] MRN: [id number] Date of Birth: [birth year]  Subjective/Objective:      S/p left reverse shoulder replacement               Action/Plan:  Spoke with patient about HHC, [facility]. Contacted [facility] and set up HHPT and HHOT. Per patient's wife he has appointment with [facility]. Contacted [facility] at [facility] and requested oxygen for home and a cane be delivered to patient's room.     Expected Discharge Date:                  Expected Discharge Plan:  Home w Home Health Services  In-House Referral:  NA  Discharge planning Services  CM Consult  Post Acute Care Choice:  Home Health, Durable Medical Equipment Choice offered to:  Patient  DME Arranged:  Gilmer Morane, Oxygen DME Agency:  [facility]  [facility]  [facility]  Status of Service:  Completed, signed off  Medicare Important Message Given:    Date Medicare IM Given:    Medicare IM give by:    Date Additional Medicare IM Given:    Additional Medicare Important Message give by:     If discussed at Long Length of Stay Meetings, dates discussed:    Additional Comments:  Monica BectonKrieg, Lilya Smitherman Watson, RN 10/21/2014, 10:45 AM

## 2014-10-21 NOTE — Progress Notes (Signed)
ANTICOAGULATION CONSULT NOTE  Pharmacy Consult:  Coumadin Indication:  History of AFib + VTE prophylaxis s/p left shoulder arthroplasty  Allergies  Allergen Reactions  . Iodinated Diagnostic Agents Itching and Swelling    Approximately 1996    Patient Measurements: Height: 5\' 7"  (170.2 cm) Weight: (!) 346 lb 2 oz (157 kg) IBW/kg (Calculated) : 66.1  Vital Signs: Temp: 97.9 F (36.6 C) (07/04 0513) Temp Source: Oral (07/04 0513) BP: 110/57 mmHg (07/04 0513) Pulse Rate: 73 (07/04 0513)  Labs:  Recent Labs  10/19/14 0424 10/20/14 [id number] 10/21/14 0356  LABPROT 17.0* 16.0* 16.3*  INR 1.37 1.27 1.30    Estimated Creatinine Clearance: 135.2 mL/min (by C-G formula based on Cr of 0.77).   Assessment: 63 YOM on Coumadin PTA for history of Afib.  Coumadin resumed s/p left shoulder arthroplasty. INR is currently sub-therapeutic at 1.3. No bleeding reported.  PTA dose: Patient was previously therapeutic on home regimen of 10mg  PO daily.  His last dose PTA was on 10/11/14  Goal of Therapy:  INR 2-3  Plan:  - Coumadin 10mg  PO daily per home dose to be continues while inpatient - Daily PT / INR while in hospital - plan to discharge home on home dose of 10 mg daily with f/u at outpt coumadin clinic per discharge orders and notes. - education provided  Chamari Cutbirth D. Macintyre Alexa, PharmD, BCPS Clinical Pharmacist Pager: [phone] 10/21/2014 10:59 AM

## 2014-10-21 NOTE — Progress Notes (Signed)
SATURATION QUALIFICATIONS: (This note is used to comply with regulatory documentation for home oxygen)  Patient Saturations on Room Air at Rest = 85%  Patient Saturations on Room Air while Ambulating =91 %  Patient Saturations on 2 Liters of oxygen while Ambulating = 96%  Please briefly explain why patient needs home oxygen: patient has history of COPD

## 2014-10-21 NOTE — Progress Notes (Signed)
Occupational Therapy Treatment Patient Details Name: Tony [name] MRN: [id number] DOB: [birth year] Today's Date: 10/21/2014    History of present illness Left reverse TSA.   OT comments  Patient better today than yesterday. Pt more aware and cognition is [facility]. Patient with slow processing and mod I for cognition, but overall functional. Pt performed tub/shower transfer, toilet transfer (using BSC), and educated patient on UB dressing and toileting tasks. Encouraged patient/wife to purchase a toileting aid to help increase independence with self hygiene. Continue to recommend HHOT post acute d/c.    Follow Up Recommendations  Supervision/Assistance - 24 hour;Home health OT    Equipment Recommendations  None recommended by OT    Recommendations for Other Services  None at this time  Precautions / Restrictions Precautions Precautions: Shoulder;Fall Type of Shoulder Precautions: no pushing, pulling, lifiting (can use to hold light items); reviewed shoulder precautions Shoulder Interventions: Shoulder sling/immobilizer;Off for dressing/bathing/exercises;At all times (sling can come off when in a controlled environment) Precaution Comments: PROM/AROM FF 0-90 degrees; PROM/AROM abduction 0-60 degrees; NO internal/external exercises, but can go into those positions with BADLS: AROM elbow-wrist-hand Required Braces or Orthoses: Sling Restrictions Weight Bearing Restrictions: Yes LUE Weight Bearing: Non weight bearing       Mobility Bed Mobility General bed mobility comments: in recliner  Transfers Overall transfer level: Needs assistance Equipment used: Straight cane Transfers: Sit to/from Stand Sit to Stand: Supervision General transfer comment: supervison for safety, min guard when getting in/out of tub/shower.    Balance Overall balance assessment: Needs assistance Sitting-balance support: No upper extremity supported;Feet supported Sitting balance-Leahy Scale: Good      Standing balance support: Single extremity supported;During functional activity Standing balance-Leahy Scale: Fair   ADL Overall ADL's : Needs assistance/impaired Upper Body Dressing : Minimal assistance General ADL Comments: Pt engaged in toilet transfer using BSC and cane, then ambualted -> therapy gym for tub/shower transfer. Pt stepped over tub with min guard assist. Pt completed UB dressing with min assist and min verbal cues for no shoulder movement/jolting of shoulder. Wife present and verbalized understanding of all this. Pt has a BSC and tub transfer bench at home for use. Patient's 02 sats between 87-93%, when sats decreased lower than 90% encouraged pursed lip breathing and sats increased within seconds.      Vision Additional Comments: No change from baseline          Cognition   Behavior During Therapy: WFL for tasks assessed/performed Overall Cognitive Status: Within Functional Limits for tasks assessed General Comments: Pt with slow processing, but overall cognition is [facility] although he may take extra time to answer questions.                  Pertinent Vitals/ Pain       Pain Assessment: Faces Faces Pain Scale: Hurts a little bit Pain Location: left shoulder  Pain Descriptors / Indicators: Grimacing Pain Intervention(s): Monitored during session;Repositioned   Frequency Min 3X/week     Progress Toward Goals  OT Goals(current goals can now befound in the care plan section)  Progress towards OT goals: Progressing toward goals  Acute Rehab OT Goals Patient Stated Goal: to go home today  Plan Discharge plan remains appropriate    End of Session Equipment Utilized During Treatment: Other (comment) (sling)   Activity Tolerance Patient tolerated treatment well   Patient Left in chair;with call bell/phone within reach;with family/visitor present    Time: 1130-1153 OT Time Calculation (min): 23 min  Charges: OT General  Charges $OT Visit: 1 Procedure OT  Treatments $Self Care/Home Management : 23-37 mins  Curtisha Bendix , MS, OTR/L, CLT Pager: [phone]  10/21/2014, 1:21 PM

## 2014-10-24 NOTE — Progress Notes (Signed)
OT Note - Addendum    10/18/14 1300  OT Visit Information  Last OT Received On 10/18/14  OT G-codes **NOT FOR INPATIENT CLASS**  Functional Assessment Tool Used clinical judgement  Functional Limitation Self care  Self Care Current Status [phone](G8987) CK  Self Care Goal Status [phone](G8988) [facility]  Vivyan Biggers, OTR/L  [phone] 10/18/2014

## 2014-10-28 ENCOUNTER — Ambulatory Visit (INDEPENDENT_AMBULATORY_CARE_PROVIDER_SITE_OTHER): Payer: BLUE CROSS/BLUE SHIELD | Admitting: *Deleted

## 2014-10-28 DIAGNOSIS — Z5181 Encounter for therapeutic drug level monitoring: Secondary | ICD-10-CM

## 2014-10-28 DIAGNOSIS — I4891 Unspecified atrial fibrillation: Secondary | ICD-10-CM | POA: Diagnosis not present

## 2014-10-28 LAB — POCT INR: INR: 2.2

## 2014-11-04 ENCOUNTER — Ambulatory Visit (INDEPENDENT_AMBULATORY_CARE_PROVIDER_SITE_OTHER): Payer: BLUE CROSS/BLUE SHIELD | Admitting: *Deleted

## 2014-11-04 DIAGNOSIS — I4891 Unspecified atrial fibrillation: Secondary | ICD-10-CM | POA: Diagnosis not present

## 2014-11-04 DIAGNOSIS — Z5181 Encounter for therapeutic drug level monitoring: Secondary | ICD-10-CM

## 2014-11-04 LAB — POCT INR: INR: 2

## 2014-11-08 ENCOUNTER — Encounter (HOSPITAL_COMMUNITY): Payer: Self-pay | Admitting: Occupational Therapy

## 2014-11-08 ENCOUNTER — Ambulatory Visit (HOSPITAL_COMMUNITY): Payer: BLUE CROSS/BLUE SHIELD | Attending: Orthopedic Surgery | Admitting: Occupational Therapy

## 2014-11-08 DIAGNOSIS — R29898 Other symptoms and signs involving the musculoskeletal system: Secondary | ICD-10-CM | POA: Insufficient documentation

## 2014-11-08 DIAGNOSIS — Z96612 Presence of left artificial shoulder joint: Secondary | ICD-10-CM | POA: Insufficient documentation

## 2014-11-08 DIAGNOSIS — M25612 Stiffness of left shoulder, not elsewhere classified: Secondary | ICD-10-CM | POA: Diagnosis present

## 2014-11-08 DIAGNOSIS — M7582 Other shoulder lesions, left shoulder: Secondary | ICD-10-CM | POA: Diagnosis present

## 2014-11-08 DIAGNOSIS — M629 Disorder of muscle, unspecified: Secondary | ICD-10-CM | POA: Insufficient documentation

## 2014-11-08 DIAGNOSIS — M25512 Pain in left shoulder: Secondary | ICD-10-CM

## 2014-11-08 DIAGNOSIS — M6289 Other specified disorders of muscle: Secondary | ICD-10-CM

## 2014-11-08 NOTE — Therapy (Signed)
[facility] [address] [place], [place], [address] Phone: [phone]   Fax:  [phone]  Occupational Therapy Evaluation  Patient Details  Name: LORENSO QUIRINO MRN: [id number] Date of Birth: [birth year] Referring Provider:  Francena Hanly, MD  Encounter Date: 11/08/2014      OT End of Session - 11/08/14 1605    Visit Number 1   Number of Visits 24   Date for OT Re-Evaluation 01/07/15  mini-reassess 12/06/2014   Authorization Type BCBS   Authorization Time Period Pt has visit limit of 30, has used 8 as of 11/08/14   Authorization - Visit Number 9   Authorization - Number of Visits 30   OT Start Time 1518   OT Stop Time 1557   OT Time Calculation (min) 39 min   Activity Tolerance Patient tolerated treatment well   Behavior During Therapy [facility] for tasks assessed/performed      Past Medical History  Diagnosis Date  . Hypertension   . Hyperlipidemia   . Atrial fibrillation     unknown onset  . Chronic anticoagulation   . Tobacco abuse   . Degenerative joint disease     Left TKA; right shoulder surgery also advised  . Obesity   . Dysrhythmia   . COPD (chronic obstructive pulmonary disease)   . Shortness of breath dyspnea   . Sleep apnea     Past Surgical History  Procedure Laterality Date  . Total knee arthroplasty      Left  . Joint replacement    . Shoulder surgery    . 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  Visit Diagnosis:  S/p reverse total shoulder arthroplasty, left  Shoulder pain, left  Decreased range of motion of shoulder, left  Left arm weakness  Tight fascia      Subjective Assessment - 11/08/14 1601    Subjective  S: I had this one done and then have to have my other done in about 3 months.    Pertinent  History Pt is a 64 y/o male s/p left reverse total shoulder replacement on 10/17/14. Pt received OT HH services until 11/01/14. MD sent reverse TSA protocol to follow for pt. Pt reports he has had little pain and is completing pendulum exercises along with elbow exercises at home. Dr. Rennis Chris referred pt to occupational therapy for evaluation and treatment.    Patient Stated Goals To get my arm back like I need it to be.    Currently in Pain? No/denies           [facility] OT Assessment - 11/08/14 1510    Assessment   Diagnosis Left reverse TSA   Onset Date 10/17/14   Prior Therapy [facility] services    Precautions   Precautions Shoulder   Type of Shoulder Precautions See Protocol: 0-4 weeks (6/30-7/28): PROM-ER to 30 degrees, Abduction to 60 degrees, flexion to 90 degrees; pendulum exercises; AAROM w/in previous measurements. Weeks 4-8 (7/28-8/25): Pulleys at week 4, PROM-ER to 45 degrees, Abduction to 90 degrees, flexion to 115 degrees. DO NOT AGGRESSIVELY PUSH ROM. Weeks 8-12 (8/25-9/22): Progress PROM/AROM as tolerated, theraband ex. Week 12 (9/22): strengthening as tolerated.    Balance Screen   Has the patient fallen in the past 6 months No   Has the patient  had a decrease in activity level because of a fear of falling?  No   Is the patient reluctant to leave their home because of a fear of falling?  No   Home  Environment   Family/patient expects to be discharged to: Private residence   Living Arrangements Spouse/significant other   Available Help at Discharge Family   Type of Home House   Prior Function   Level of Independence Independent with basic ADLs   Vocation Full time employment   Vocation Requirements drive spreader truck, maintenance, lifting, reaching   Leisure softball (watching)    ADL   ADL comments Pt has difficulty with dressing, bathing, grooming, reaching into high cabinets, lifting items. Pt is unable to mow grass & complete yardwork   Written Expression   Dominant Hand  Right   Vision - History   Baseline Vision Wears glasses all the time   Cognition   Overall Cognitive Status Within Functional Limits for tasks assessed   ROM / Strength   AROM / PROM / Strength AROM;PROM;Strength   Palpation   Palpation comment Max fascial restrictions in left upper arm, anterior and medial deltoid, and trapezius regions   AROM   Overall AROM Comments Assessed in supine, ER/IR adducted   AROM Assessment Site Shoulder   Right/Left Shoulder Left   Left Shoulder Flexion 90 Degrees   Left Shoulder ABduction 57 Degrees   Left Shoulder Internal Rotation 90 Degrees   Left Shoulder External Rotation 0 Degrees   Left Shoulder Horizontal ABduction -19 Degrees   PROM   Overall PROM Comments Assessed in supine, ER/IR adducted    PROM Assessment Site Shoulder   Right/Left Shoulder Left   Left Shoulder Flexion 118 Degrees   Left Shoulder ABduction 70 Degrees   Left Shoulder Internal Rotation 90 Degrees   Left Shoulder External Rotation -13 Degrees   Strength   Overall Strength Comments unable to test this date    Strength Assessment Site Shoulder                OT Education - 11/08/14 1605    Education provided Yes   Education Details pendulum exercises, elbow AROM exercises   Person(s) Educated Patient   Methods Explanation;Demonstration   Comprehension Verbalized understanding;Returned demonstration          OT Short Term Goals - 11/08/14 1611    OT SHORT TERM GOAL #1   Title Pt will be educated on and independent in HEP.    Time 4   Period Weeks   Status New   OT SHORT TERM GOAL #2   Title Pt will decrease pain to 3/10 or less during daily tasks.    Time 4   Period Weeks   Status New   OT SHORT TERM GOAL #3   Title Pt will decrease fascial restrictions from max to mod amount.    Time 4   Period Weeks   Status New   OT SHORT TERM GOAL #4   Title Pt will increase PROM to greatest range within protocol limits.    Time 4   Period Weeks   Status  New   OT SHORT TERM GOAL #5   Title Pt will increase strength to 3/5 to increase ability to donn shirts.    Time 4   Period Weeks   Status New           OT Long Term Goals - 11/08/14 1613    OT LONG TERM GOAL #1  Title Pt will return to previous level of function and independence in daily activities.    Time 8   Period Weeks   Status New   OT LONG TERM GOAL #2   Title Pt will decrease pain to 1/10 or less during daily tasks.    Time 8   Period Weeks   Status New   OT LONG TERM GOAL #3   Title Pt will decrease fascial restrictions from mod to min amounts or less.    Time 8   Period Weeks   Status New   OT LONG TERM GOAL #4   Title Pt will increase AROM to [facility] to increase ability to reach into overhead cabinets.    Time 8   Period Weeks   Status New   OT LONG TERM GOAL #5   Title Pt will increase strength to 4/5 to increase ability to lift items when working.    Time 8   Period Weeks   Status New               Plan - 11/08/14 1606    Clinical Impression Statement A: Pt is a 64 y/o male s/p left reverse total shoulder replacement on 10/17/14, causing increased pain and fascial restrictions, decreased range of motion and strength, limiting ability to complete daily activities using LUE. Pt has been using ice at night for pain relief. Pt has protocol to follow, will need to review as pt completed abduction AROM to rest arm on counter during evaluation-outside of ROM ranges per protocol.    Pt will benefit from skilled therapeutic intervention in order to improve on the following deficits (Retired) Decreased strength;Pain;Decreased activity tolerance;Impaired UE functional use;Increased fascial restricitons;Decreased range of motion;Impaired flexibility;Decreased knowledge of precautions   Rehab Potential Good   OT Frequency 3x / week   OT Duration 8 weeks   OT Treatment/Interventions Self-care/ADL training;Passive range of motion;Patient/family  education;Cryotherapy;Electrical Stimulation;Moist Heat;Therapeutic exercise;Manual Therapy;Therapeutic activities   Plan P: Pt will benefit from skilled occupational therapy services to decrease pain and fascial restrictions, increase range of motion and strength to complete daily activities. Treatment plan: myofascial release, manual therapy, PROM, AAROM, AROM, general LUE strengthening, scapular stability & strengthening. Next session: Review protocol with pt.    Consulted and Agree with Plan of Care Patient        Problem List Patient Active Problem List   Diagnosis Date Noted  . 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 Encounter for therapeutic drug monitoring 05/11/2013  . Hypertension   . Hyperlipidemia   . Chronic anticoagulation   . Degenerative joint disease   . Obesity   . TOBACCO ABUSE 03/18/2009  . Atrial fibrillation 03/18/2009    Ezra Sites, OTR/L  [phone]  11/08/2014, 4:18 PM  [facility] [address] [place], [place], [address] Phone: [phone]   Fax:  [phone]

## 2014-11-11 ENCOUNTER — Ambulatory Visit (HOSPITAL_COMMUNITY): Payer: BLUE CROSS/BLUE SHIELD

## 2014-11-11 ENCOUNTER — Encounter (HOSPITAL_COMMUNITY): Payer: Self-pay

## 2014-11-11 DIAGNOSIS — R29898 Other symptoms and signs involving the musculoskeletal system: Secondary | ICD-10-CM

## 2014-11-11 DIAGNOSIS — M25512 Pain in left shoulder: Secondary | ICD-10-CM

## 2014-11-11 DIAGNOSIS — M629 Disorder of muscle, unspecified: Secondary | ICD-10-CM

## 2014-11-11 DIAGNOSIS — M25612 Stiffness of left shoulder, not elsewhere classified: Secondary | ICD-10-CM

## 2014-11-11 DIAGNOSIS — Z96612 Presence of left artificial shoulder joint: Secondary | ICD-10-CM | POA: Diagnosis not present

## 2014-11-11 DIAGNOSIS — M6289 Other specified disorders of muscle: Secondary | ICD-10-CM

## 2014-11-11 NOTE — Therapy (Signed)
[facility] [address] [place], [place], [address] Phone: [phone]   Fax:  [phone]  Occupational Therapy Treatment  Patient Details  Name: Tony [name] MRN: [id number] Date of Birth: 10-06-1950 Referring Provider:  Francena Hanly, MD  Encounter Date: 11/11/2014      OT End of Session - 11/11/14 1253    Visit Number 2   Number of Visits 24   Date for OT Re-Evaluation 01/07/15  mini-reassess 12/06/2014   Authorization Type BCBS   Authorization Time Period Pt has visit limit of 30, has used 8 as of 11/08/14   Authorization - Visit Number 10   Authorization - Number of Visits 30   OT Start Time 1145   OT Stop Time 1232   OT Time Calculation (min) 47 min   Activity Tolerance Patient tolerated treatment well   Behavior During Therapy [facility] for tasks assessed/performed      Past Medical History  Diagnosis Date  . Hypertension   . Hyperlipidemia   . Atrial fibrillation     unknown onset  . Chronic anticoagulation   . Tobacco abuse   . Degenerative joint disease     Left TKA; right shoulder surgery also advised  . Obesity   . Dysrhythmia   . COPD (chronic obstructive pulmonary disease)   . Shortness of breath dyspnea   . Sleep apnea     Past Surgical History  Procedure Laterality Date  . Total knee arthroplasty      Left  . Joint replacement    . Shoulder surgery    . 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  Visit Diagnosis:  Shoulder pain, left  Left arm weakness  Tight fascia  Shoulder joint stiffness, left      Subjective Assessment - 11/11/14 1224    Subjective  S: I'm doing my exercises at home. I just don't feel ike I'm doing enough.    Currently in Pain? Yes   Pain Score 2    Pain Location Shoulder   Pain  Orientation Left   Pain Descriptors / Indicators Aching   Pain Type Surgical pain            [facility] OT Assessment - 11/11/14 1225    Assessment   Diagnosis Left reverse TSA   Precautions   Precautions Shoulder   Type of Shoulder Precautions See Protocol: 0-4 weeks (6/30-7/28): PROM-ER to 30 degrees, Abduction to 60 degrees, flexion to 90 degrees; pendulum exercises; AAROM w/in previous measurements. Weeks 4-8 (7/28-8/25): Pulleys at week 4, PROM-ER to 45 degrees, Abduction to 90 degrees, flexion to 115 degrees. DO NOT AGGRESSIVELY PUSH ROM. Weeks 8-12 (8/25-9/22): Progress PROM/AROM as tolerated, theraband ex. Week 12 (9/22): strengthening as tolerated.                   OT Treatments/Exercises (OP) - 11/11/14 1242    Exercises   Exercises Shoulder   Shoulder Exercises: Supine   Protraction PROM;5 reps   Horizontal ABduction PROM;5 reps   External Rotation PROM;AAROM;5 reps  With therapist providing assistance for AAROM.   Internal Rotation PROM;AAROM;5 reps  With therapist providing assistance for [facility]    Flexion PROM;AAROM;5 reps  With therapist providing assistance for AAROM.   ABduction PROM;AAROM;5 reps  With therapist providing assistance for  AAROM.   Shoulder Exercises: Seated   Elevation AROM;10 reps   Extension AROM;10 reps   Row AROM;10 reps   Other Seated Exercises "Hitchhiker" and "upper cut" exercise; 10X   Shoulder Exercises: Pulleys   Flexion 1 minute;Limitations   Flexion Limitations Staying within set protocol   ABduction 1 minute;Limitations   ABduction Limitations Staying within set protocol.   Shoulder Exercises: Isometric Strengthening   Flexion Supine;3X3"   Extension Supine;3X3"   External Rotation Supine;3X3"   Internal Rotation Supine;3X3"   ABduction Supine;3X3"   ADduction Supine;3X3"   Manual Therapy   Manual Therapy Myofascial release   Myofascial Release Myofascial release to left upper arm, trapezius, and scapularis region to  decrease fascial restrictions and increase joint mobility in a pain free zone.                 OT Education - 11/11/14 1252    Education provided Yes   Education Details OT evaluation print out, reviewed therapy goals, reviewed MD protocol. Reviewed proper adjustment of sling.    Person(s) Educated Patient   Methods Explanation;Handout   Comprehension Verbalized understanding          OT Short Term Goals - 11/11/14 1257    OT SHORT TERM GOAL #1   Title Pt will be educated on and independent in HEP.    Status On-going   OT SHORT TERM GOAL #2   Title Pt will decrease pain to 3/10 or less during daily tasks.    Status On-going   OT SHORT TERM GOAL #3   Title Pt will decrease fascial restrictions from max to mod amount.    Status On-going   OT SHORT TERM GOAL #4   Title Pt will increase PROM to greatest range within protocol limits.    Status On-going   OT SHORT TERM GOAL #5   Title Pt will increase strength to 3/5 to increase ability to donn shirts.    Status On-going           OT Long Term Goals - 11/11/14 1258    OT LONG TERM GOAL #1   Title Pt will return to previous level of function and independence in daily activities.    Status On-going   OT LONG TERM GOAL #2   Title Pt will decrease pain to 1/10 or less during daily tasks.    Status On-going   OT LONG TERM GOAL #3   Title Pt will decrease fascial restrictions from mod to min amounts or less.    Status On-going   OT LONG TERM GOAL #4   Title Pt will increase AROM to [facility] to increase ability to reach into overhead cabinets.    Status On-going   OT LONG TERM GOAL #5   Title Pt will increase strength to 4/5 to increase ability to lift items when working.    Status On-going               Plan - 11/11/14 1254    Clinical Impression Statement A: Initiated myofascial release, PROM, AAROM, isometrics, scapular AROM exercises, and pulleys. Remained without diameters of protocol throughout session. Pt  tolerated well.    Plan Provide vc's to remain within proctol during exercisese as needed. Add AROM elbow and wrist exercises. Add therapy ball within protocol. Progress to next phase on 7/28.        Problem List Patient Active Problem List   Diagnosis Date Noted  . 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 Encounter  for therapeutic drug monitoring 05/11/2013  . Hypertension   . Hyperlipidemia   . Chronic anticoagulation   . Degenerative joint disease   . Obesity   . TOBACCO ABUSE 03/18/2009  . Atrial fibrillation 03/18/2009    Limmie Patricia, OTR/L,CBIS  [phone]  11/11/2014, 1:00 PM  [place] [facility] [address] [place], [place], [address] Phone: [phone]   Fax:  [phone]

## 2014-11-13 ENCOUNTER — Ambulatory Visit (HOSPITAL_COMMUNITY): Payer: BLUE CROSS/BLUE SHIELD | Admitting: Occupational Therapy

## 2014-11-13 ENCOUNTER — Encounter (HOSPITAL_COMMUNITY): Payer: Self-pay | Admitting: Occupational Therapy

## 2014-11-13 DIAGNOSIS — M629 Disorder of muscle, unspecified: Secondary | ICD-10-CM

## 2014-11-13 DIAGNOSIS — M6289 Other specified disorders of muscle: Secondary | ICD-10-CM

## 2014-11-13 DIAGNOSIS — R29898 Other symptoms and signs involving the musculoskeletal system: Secondary | ICD-10-CM

## 2014-11-13 DIAGNOSIS — M25512 Pain in left shoulder: Secondary | ICD-10-CM

## 2014-11-13 DIAGNOSIS — Z96612 Presence of left artificial shoulder joint: Secondary | ICD-10-CM | POA: Diagnosis not present

## 2014-11-13 DIAGNOSIS — M25612 Stiffness of left shoulder, not elsewhere classified: Secondary | ICD-10-CM

## 2014-11-13 NOTE — Discharge Summary (Signed)
PATIENT ID:      Tony [name]  MRN:     [id number] DOB/AGE:    08-20-1950 / 64 y.o.     DISCHARGE SUMMARY  ADMISSION DATE:    10/17/2014 DISCHARGE DATE:  10/21/2014  ADMISSION DIAGNOSIS: LEFT SHOULDER ROTATAR CUFF TEAR,ARTHOPATHY Past Medical History  Diagnosis Date  . Hypertension   . Hyperlipidemia   . Atrial fibrillation     unknown onset  . Chronic anticoagulation   . Tobacco abuse   . Degenerative joint disease     Left TKA; right shoulder surgery also advised  . Obesity   . Dysrhythmia   . COPD (chronic obstructive pulmonary disease)   . Shortness of breath dyspnea   . Sleep apnea     DISCHARGE DIAGNOSIS:   Active Problems:   S/P shoulder replacement   PROCEDURE: Procedure(s): LEFT REVERSE SHOULDER ARTHROPLASTY on 10/17/2014  CONSULTS:     HISTORY:  See H&P in chart.  HOSPITAL COURSE:  Tony [name] is a 64 y.o. admitted on 10/17/2014 with a chief complaint of left shoulder pain and dysfunction, and found to have a diagnosis of LEFT SHOULDER ROTATAR CUFF TEAR,ARTHOPATHY.  They were brought to the operating room on 10/17/2014 and underwent Procedure(s): LEFT REVERSE SHOULDER ARTHROPLASTY.    They were given perioperative antibiotics:  Anti-infectives    Start     Dose/Rate Route Frequency Ordered Stop   10/17/14 1700  ceFAZolin (ANCEF) IVPB 2 g/50 mL premix     2 g 100 mL/hr over 30 Minutes Intravenous Every 6 hours 10/17/14 1648 10/18/14 0633   10/17/14 0915  ceFAZolin (ANCEF) 3 g in dextrose 5 % 50 mL IVPB     3 g 160 mL/hr over 30 Minutes Intravenous To [facility] 10/16/14 1241 10/17/14 1110    .  Patient underwent the above named procedure and tolerated it well. The following day he had a difficult  Time as he was somewhat overmedicated and his oxygenation was low. He was extremely sensitive  To the narcotics and more than likely had undiagnosed sleep apnea which likely contributed. It took a few days to clear and to be able to work with therapy to  mobilize to where he was safe enough to discharge home. On date of discharge they were hemodynamically stable and pain was controlled on oral analgesics. They were neurovascularly intact to the operative extremity. Home health PT/OT was ordered and worked with patient per protocol. They were medically and orthopaedically stable for discharge on 10/21/2014.    DIAGNOSTIC STUDIES:  RECENT RADIOGRAPHIC STUDIES :  No results found.  RECENT VITAL SIGNS:  No data found. [name]  RECENT EKG RESULTS:    Orders placed or performed in visit on 08/07/14  . EKG 12-Lead    DISCHARGE INSTRUCTIONS:  Discharge Instructions    Call MD / Call 911    Complete by:  As directed   If you experience chest pain or shortness of breath, CALL 911 and be transported to the hospital emergency room.  If you develope a fever above 101 F, pus (white drainage) or increased drainage or redness at the wound, or calf pain, call your surgeon's office.     Constipation Prevention    Complete by:  As directed   Drink plenty of fluids.  Prune juice may be helpful.  You may use a stool softener, such as Colace (over the counter) 100 mg twice a day.  Use MiraLax (over the counter) for constipation as needed.  Diet - low sodium heart healthy    Complete by:  As directed      Discharge instructions    Complete by:  As directed   Oxygen use per home health nursing to maintain O2 sats >90%, start at 2L [place] O2     Driving restrictions    Complete by:  As directed   No driving until cleared by Dr. Dub Mikes office     Increase activity slowly as tolerated    Complete by:  As directed            DISCHARGE MEDICATIONS:     Medication List    STOP taking these medications        ibuprofen 200 MG tablet  Commonly known as:  ADVIL,MOTRIN      TAKE these medications        atorvastatin 40 MG tablet  Commonly known as:  LIPITOR  Take 1 tablet (40 mg total) by mouth daily.     diltiazem 240 MG 24 hr capsule  Commonly known  as:  CARDIZEM CD  Take 1 capsule by mouth twice a day     HYDROcodone-acetaminophen 5-325 MG per tablet  Commonly known as:  NORCO/VICODIN  Take 1 tablet by mouth every 6 (six) hours as needed for severe pain.     losartan-hydrochlorothiazide 100-12.5 MG per tablet  Commonly known as:  HYZAAR  Take 1 tablet by mouth at bedtime.     traMADol 50 MG tablet  Commonly known as:  ULTRAM  Take 1 tablet (50 mg total) by mouth every 6 (six) hours as needed for moderate pain.     warfarin 10 MG tablet  Commonly known as:  COUMADIN  Take 1 tablet daily or as directed        FOLLOW UP VISIT:       Follow-up Information    Follow up with [facility].   Why:  They will contact you to schedule home therapy visits.   Contact information:   [address] [place] [address] [phone]       Follow up with Senaida Lange, MD In 7 days.   Specialty:  Orthopedic Surgery   Why:  For wound check   Contact information:   [address] [place] [address] [phone]       DISCHARGE TO: Home  DISPOSITION: Good  DISCHARGE CONDITION:  Tony [name] for Dr. Francena Hanly 11/13/2014, 11:31 AM

## 2014-11-13 NOTE — Therapy (Signed)
[facility] [address] [place], [place], [address] Phone: [phone]   Fax:  [phone]  Occupational Therapy Treatment  Patient Details  Name: Tony [name] MRN: [id number] Date of Birth: 09/22/1950 Referring Provider:  Francena Hanly, MD  Encounter Date: 11/13/2014      OT End of Session - 11/13/14 1259    Visit Number 3   Number of Visits 24   Date for OT Re-Evaluation 01/07/15  mini-reassess 12/06/2014   Authorization Type BCBS   Authorization Time Period Pt has visit limit of 30, has used 8 as of 11/08/14   Authorization - Visit Number 11   Authorization - Number of Visits 30   OT Start Time 1149   OT Stop Time 1232   OT Time Calculation (min) 43 min   Activity Tolerance Patient tolerated treatment well   Behavior During Therapy [facility] for tasks assessed/performed      Past Medical History  Diagnosis Date  . Hypertension   . Hyperlipidemia   . Atrial fibrillation     unknown onset  . Chronic anticoagulation   . Tobacco abuse   . Degenerative joint disease     Left TKA; right shoulder surgery also advised  . Obesity   . Dysrhythmia   . COPD (chronic obstructive pulmonary disease)   . Shortness of breath dyspnea   . Sleep apnea     Past Surgical History  Procedure Laterality Date  . Total knee arthroplasty      Left  . Joint replacement    . Shoulder surgery    . 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  Visit Diagnosis:  Shoulder pain, left  Left arm weakness  Tight fascia  Shoulder joint stiffness, left  Decreased range of motion of shoulder, left      Subjective Assessment - 11/13/14 1146    Subjective  S: My arm was so sore last night I stopped doing those exercises.    Currently in Pain? No/denies             [facility] OT Assessment - 11/13/14 1145    Assessment   Diagnosis Left reverse TSA   Precautions   Precautions Shoulder   Type of Shoulder Precautions See Protocol: 0-4 weeks (6/30-7/28): PROM-ER to 30 degrees, Abduction to 60 degrees, flexion to 90 degrees; pendulum exercises; AAROM w/in previous measurements. Weeks 4-8 (7/28-8/25): Pulleys at week 4, PROM-ER to 45 degrees, Abduction to 90 degrees, flexion to 115 degrees. DO NOT AGGRESSIVELY PUSH ROM. Weeks 8-12 (8/25-9/22): Progress PROM/AROM as tolerated, theraband ex. Week 12 (9/22): strengthening as tolerated.                   OT Treatments/Exercises (OP) - 11/13/14 1152    Exercises   Exercises Shoulder;Elbow   Shoulder Exercises: Supine   Protraction PROM;5 reps   Horizontal ABduction PROM;5 reps   External Rotation PROM;AAROM;5 reps   External Rotation Limitations within protocol    Internal Rotation PROM;AAROM;5 reps   Internal Rotation Limitations within protocol   Flexion PROM;AAROM;5 reps   Flexion Limitations within protocol   ABduction PROM;AAROM;5 reps   ABduction Limitations within protocol   Shoulder Exercises: Seated   Elevation AROM;10 reps   Extension AROM;10 reps   Row AROM;10 reps   Other Seated  Exercises "Hitchhiker" and "upper cut" exercise; 10X   Shoulder Exercises: Pulleys   Flexion 1 minute;Limitations   Flexion Limitations Staying within set protocol   ABduction 1 minute;Limitations   ABduction Limitations Staying within set protocol.   Shoulder Exercises: Isometric Strengthening   Flexion Supine;3X3"   Extension Supine;3X3"   External Rotation Supine;3X3"   Internal Rotation Supine;3X3"   ABduction Supine;3X3"   ADduction Supine;3X3"   Elbow Exercises   Elbow Extension PROM;AROM;10 reps   Forearm Supination PROM;AROM;10 reps   Forearm Pronation PROM;AROM;10 reps   Other elbow exercises elbow flexion PROM/AROM 10X   Manual Therapy   Manual Therapy Myofascial release    Myofascial Release Myofascial release to left upper arm, trapezius, and scapularis region to decrease fascial restrictions and increase joint mobility in a pain free zone.                   OT Short Term Goals - 11/11/14 1257    OT SHORT TERM GOAL #1   Title Pt will be educated on and independent in HEP.    Status On-going   OT SHORT TERM GOAL #2   Title Pt will decrease pain to 3/10 or less during daily tasks.    Status On-going   OT SHORT TERM GOAL #3   Title Pt will decrease fascial restrictions from max to mod amount.    Status On-going   OT SHORT TERM GOAL #4   Title Pt will increase PROM to greatest range within protocol limits.    Status On-going   OT SHORT TERM GOAL #5   Title Pt will increase strength to 3/5 to increase ability to donn shirts.    Status On-going           OT Long Term Goals - 11/11/14 1258    OT LONG TERM GOAL #1   Title Pt will return to previous level of function and independence in daily activities.    Status On-going   OT LONG TERM GOAL #2   Title Pt will decrease pain to 1/10 or less during daily tasks.    Status On-going   OT LONG TERM GOAL #3   Title Pt will decrease fascial restrictions from mod to min amounts or less.    Status On-going   OT LONG TERM GOAL #4   Title Pt will increase AROM to [facility] to increase ability to reach into overhead cabinets.    Status On-going   OT LONG TERM GOAL #5   Title Pt will increase strength to 4/5 to increase ability to lift items when working.    Status On-going               Plan - 11/13/14 1259    Clinical Impression Statement A: Added therapy ball within protocol limits, added AROM elbow exercises. Continued AAROM exercises, verbal cuing to remain within precautions. Adjusted pt's sling at end of session. Educated pt on protocol and restrictions.    Plan P: Progress to next phase of protocol.         Problem List Patient Active Problem List   Diagnosis Date Noted  . S/P  shoulder replacement 10/17/2014  . Encounter for therapeutic drug monitoring 05/11/2013  . Hypertension   . Hyperlipidemia   . Chronic anticoagulation   . Degenerative joint disease   . Obesity   . TOBACCO ABUSE 03/18/2009  . Atrial fibrillation 03/18/2009    Ezra Sites, OTR/L  [phone]  11/13/2014, 1:01 PM  [facility]  [facility] [place], [place], [address] Phone: [phone]   Fax:  [phone]

## 2014-11-15 ENCOUNTER — Encounter (HOSPITAL_COMMUNITY): Payer: Self-pay | Admitting: Occupational Therapy

## 2014-11-15 ENCOUNTER — Ambulatory Visit (HOSPITAL_COMMUNITY): Payer: BLUE CROSS/BLUE SHIELD | Admitting: Occupational Therapy

## 2014-11-15 DIAGNOSIS — Z96612 Presence of left artificial shoulder joint: Secondary | ICD-10-CM | POA: Diagnosis not present

## 2014-11-15 DIAGNOSIS — M629 Disorder of muscle, unspecified: Secondary | ICD-10-CM

## 2014-11-15 DIAGNOSIS — M25612 Stiffness of left shoulder, not elsewhere classified: Secondary | ICD-10-CM

## 2014-11-15 DIAGNOSIS — M25512 Pain in left shoulder: Secondary | ICD-10-CM

## 2014-11-15 DIAGNOSIS — M6289 Other specified disorders of muscle: Secondary | ICD-10-CM

## 2014-11-15 DIAGNOSIS — R29898 Other symptoms and signs involving the musculoskeletal system: Secondary | ICD-10-CM

## 2014-11-15 NOTE — Therapy (Signed)
[facility] [address] [place], [place], [address] Phone: [phone]   Fax:  [phone]  Occupational Therapy Treatment  Patient Details  Name: Tony [name] MRN: [id number] Date of Birth: August 02, 1950 Referring Provider:  Francena Hanly, MD  Encounter Date: 11/15/2014      OT End of Session - 11/15/14 1245    Visit Number 4   Number of Visits 24   Date for OT Re-Evaluation 01/07/15  mini-reassess 12/06/2014   Authorization Type BCBS   Authorization Time Period Pt has visit limit of 30, has used 8 as of 11/08/14   Authorization - Visit Number 12   Authorization - Number of Visits 30   OT Start Time 1146   OT Stop Time 1230   OT Time Calculation (min) 44 min   Activity Tolerance Patient tolerated treatment well   Behavior During Therapy [facility] for tasks assessed/performed      Past Medical History  Diagnosis Date  . Hypertension   . Hyperlipidemia   . Atrial fibrillation     unknown onset  . Chronic anticoagulation   . Tobacco abuse   . Degenerative joint disease     Left TKA; right shoulder surgery also advised  . Obesity   . Dysrhythmia   . COPD (chronic obstructive pulmonary disease)   . Shortness of breath dyspnea   . Sleep apnea     Past Surgical History  Procedure Laterality Date  . Total knee arthroplasty      Left  . Joint replacement    . Shoulder surgery    . 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  Visit Diagnosis:  Shoulder pain, left  Left arm weakness  Tight fascia  Shoulder joint stiffness, left  Decreased range of motion of shoulder, left      Subjective Assessment - 11/15/14 1148    Subjective  S: My arm isn't sore and it doesn't hurt it's just tight.    Currently in Pain? No/denies             [facility] OT Assessment - 11/15/14 1245    Assessment   Diagnosis Left reverse TSA   Precautions   Precautions Shoulder   Type of Shoulder Precautions See Protocol: 0-4 weeks (6/30-7/28): PROM-ER to 30 degrees, Abduction to 60 degrees, flexion to 90 degrees; pendulum exercises; AAROM w/in previous measurements. Weeks 4-8 (7/28-8/25): Pulleys at week 4, PROM-ER to 45 degrees, Abduction to 90 degrees, flexion to 115 degrees. DO NOT AGGRESSIVELY PUSH ROM. Weeks 8-12 (8/25-9/22): Progress PROM/AROM as tolerated, theraband ex. Week 12 (9/22): strengthening as tolerated.                   OT Treatments/Exercises (OP) - 11/15/14 1219    Exercises   Exercises Shoulder;Elbow   Shoulder Exercises: Supine   Protraction PROM;5 reps;AAROM;10 reps   Horizontal ABduction PROM;5 reps;AAROM;10 reps   External Rotation PROM;5 reps;AAROM;10 reps   External Rotation Limitations within protocol    Internal Rotation PROM;5 reps;AAROM;10 reps   Internal Rotation Limitations within protocol   Flexion PROM;5 reps;AAROM;10 reps   Flexion Limitations within protocol   ABduction PROM;5 reps;AAROM;10 reps   ABduction Limitations within protocol   Shoulder Exercises: Seated   Elevation AROM;15 reps   Extension AROM;15 reps   Row AROM;15  reps   Shoulder Exercises: Pulleys   Flexion 1 minute;Limitations   Flexion Limitations Staying within set protocol   ABduction 1 minute;Limitations   ABduction Limitations Staying within set protocol.   Shoulder Exercises: Therapy Ball   Flexion 10 reps   Flexion Limitations within protocol   ABduction 10 reps   ABduction Limitations within protocol   Shoulder Exercises: Isometric Strengthening   Flexion Supine;3X5"   Extension Supine;3X5"   External Rotation Supine;3X5"   Internal Rotation Supine;3X5"   ABduction Supine;3X5"   ADduction Supine;3X5"   Manual Therapy   Manual Therapy Myofascial release   Myofascial Release Myofascial release to left upper arm,  trapezius, and scapularis region to decrease fascial restrictions and increase joint mobility in a pain free zone.                   OT Short Term Goals - 11/11/14 1257    OT SHORT TERM GOAL #1   Title Pt will be educated on and independent in HEP.    Status On-going   OT SHORT TERM GOAL #2   Title Pt will decrease pain to 3/10 or less during daily tasks.    Status On-going   OT SHORT TERM GOAL #3   Title Pt will decrease fascial restrictions from max to mod amount.    Status On-going   OT SHORT TERM GOAL #4   Title Pt will increase PROM to greatest range within protocol limits.    Status On-going   OT SHORT TERM GOAL #5   Title Pt will increase strength to 3/5 to increase ability to donn shirts.    Status On-going           OT Long Term Goals - 11/11/14 1258    OT LONG TERM GOAL #1   Title Pt will return to previous level of function and independence in daily activities.    Status On-going   OT LONG TERM GOAL #2   Title Pt will decrease pain to 1/10 or less during daily tasks.    Status On-going   OT LONG TERM GOAL #3   Title Pt will decrease fascial restrictions from mod to min amounts or less.    Status On-going   OT LONG TERM GOAL #4   Title Pt will increase AROM to [facility] to increase ability to reach into overhead cabinets.    Status On-going   OT LONG TERM GOAL #5   Title Pt will increase strength to 4/5 to increase ability to lift items when working.    Status On-going               Plan - 11/15/14 1246    Clinical Impression Statement A: Progressed to phase 2 of protocol. Added AAROM in supine within set limits, verbal and tactile cuing to remain in precautions. Pt tolerated well, pt did express increased pain/soreness at end of session. Pt is using ice for pain management.    Plan P: Continue to follow protocol. Add AAROM supine if appropriate.         Problem List Patient Active Problem List   Diagnosis Date Noted  . S/P shoulder  replacement 10/17/2014  . Encounter for therapeutic drug monitoring 05/11/2013  . Hypertension   . Hyperlipidemia   . Chronic anticoagulation   . Degenerative joint disease   . Obesity   . TOBACCO ABUSE 03/18/2009  . Atrial fibrillation 03/18/2009    Ezra Sites, OTR/L  [phone]  11/15/2014, 12:58 PM  [facility]  [place], [place], [address] Phone: [phone]   Fax:  [phone]

## 2014-11-18 ENCOUNTER — Ambulatory Visit (HOSPITAL_COMMUNITY): Payer: BLUE CROSS/BLUE SHIELD | Admitting: Specialist

## 2014-11-19 ENCOUNTER — Ambulatory Visit (HOSPITAL_COMMUNITY): Payer: BLUE CROSS/BLUE SHIELD | Attending: Orthopedic Surgery | Admitting: Occupational Therapy

## 2014-11-19 DIAGNOSIS — M25512 Pain in left shoulder: Secondary | ICD-10-CM | POA: Diagnosis not present

## 2014-11-19 DIAGNOSIS — M7582 Other shoulder lesions, left shoulder: Secondary | ICD-10-CM | POA: Insufficient documentation

## 2014-11-19 DIAGNOSIS — M629 Disorder of muscle, unspecified: Secondary | ICD-10-CM

## 2014-11-19 DIAGNOSIS — R29898 Other symptoms and signs involving the musculoskeletal system: Secondary | ICD-10-CM | POA: Diagnosis present

## 2014-11-19 DIAGNOSIS — M25612 Stiffness of left shoulder, not elsewhere classified: Secondary | ICD-10-CM | POA: Diagnosis present

## 2014-11-19 DIAGNOSIS — M6289 Other specified disorders of muscle: Secondary | ICD-10-CM

## 2014-11-19 NOTE — Therapy (Signed)
[facility] [address] [place], [place], [address] Phone: [phone]   Fax:  [phone]  Occupational Therapy Treatment  Patient Details  Name: Tony [name] MRN: [id number] Date of Birth: [birth year] Referring Provider:  Francena Hanly, MD  Encounter Date: 11/19/2014      OT End of Session - 11/19/14 1446    Visit Number 5   Number of Visits 24   Date for OT Re-Evaluation 01/07/15  mini-reassess 12/06/2014   Authorization Type BCBS   Authorization Time Period Pt has visit limit of 30, has used 8 as of 11/08/14   Authorization - Visit Number 13   Authorization - Number of Visits 30   OT Start Time 1350   OT Stop Time 1430   OT Time Calculation (min) 40 min   Activity Tolerance Patient tolerated treatment well   Behavior During Therapy [facility] for tasks assessed/performed      Past Medical History  Diagnosis Date  . Hypertension   . Hyperlipidemia   . Atrial fibrillation     unknown onset  . Chronic anticoagulation   . Tobacco abuse   . Degenerative joint disease     Left TKA; right shoulder surgery also advised  . Obesity   . Dysrhythmia   . COPD (chronic obstructive pulmonary disease)   . Shortness of breath dyspnea   . Sleep apnea     Past Surgical History  Procedure Laterality Date  . Total knee arthroplasty      Left  . Joint replacement    . Shoulder surgery    . 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  Visit Diagnosis:  Shoulder pain, left  Left arm weakness  Tight fascia  Shoulder joint stiffness, left  Decreased range of motion of shoulder, left      Subjective Assessment - 11/19/14 1351    Subjective  S: It's been sore all weekend, I didn't do any exercises.    Currently in Pain? No/denies             [facility] OT Assessment - 11/19/14 1346    Assessment   Diagnosis Left reverse TSA   Precautions   Precautions Shoulder   Type of Shoulder Precautions See Protocol: 0-4 weeks (6/30-7/28): PROM-ER to 30 degrees, Abduction to 60 degrees, flexion to 90 degrees; pendulum exercises; AAROM w/in previous measurements. Weeks 4-8 (7/28-8/25): Pulleys at week 4, PROM-ER to 45 degrees, Abduction to 90 degrees, flexion to 115 degrees. DO NOT AGGRESSIVELY PUSH ROM. Weeks 8-12 (8/25-9/22): Progress PROM/AROM as tolerated, theraband ex. Week 12 (9/22): strengthening as tolerated.                   OT Treatments/Exercises (OP) - 11/19/14 1352    Exercises   Exercises Shoulder;Elbow   Shoulder Exercises: Supine   Protraction PROM;5 reps;AAROM;10 reps   Horizontal ABduction PROM;5 reps;AAROM;10 reps   External Rotation PROM;5 reps;AAROM;10 reps   External Rotation Limitations within protocol    Internal Rotation PROM;5 reps;AAROM;10 reps   Internal Rotation Limitations within protocol   Flexion PROM;5 reps;AAROM;10 reps   Flexion Limitations within protocol   ABduction PROM;5 reps;AAROM;10 reps   ABduction Limitations within protocol   Shoulder Exercises: Seated   Elevation AROM;15 reps   Extension AROM;15 reps   Row AROM;15 reps  Shoulder Exercises: Pulleys   Flexion --  1'30"   Flexion Limitations Staying within set protocol   ABduction --  1'30"   ABduction Limitations Staying within set protocol.   Shoulder Exercises: Therapy Ball   Flexion 15 reps   Flexion Limitations within protocol   ABduction 15 reps   ABduction Limitations within protocol   Shoulder Exercises: Isometric Strengthening   Flexion Supine;3X5"   Extension Supine;3X5"   External Rotation Supine;3X5"   Internal Rotation Supine;3X5"   ABduction Supine;3X5"   ADduction Supine;3X5"   Manual Therapy   Manual Therapy Myofascial release   Myofascial Release Myofascial release to left upper arm, trapezius, and  scapularis region to decrease fascial restrictions and increase joint mobility in a pain free zone.                   OT Short Term Goals - 11/11/14 1257    OT SHORT TERM GOAL #1   Title Pt will be educated on and independent in HEP.    Status On-going   OT SHORT TERM GOAL #2   Title Pt will decrease pain to 3/10 or less during daily tasks.    Status On-going   OT SHORT TERM GOAL #3   Title Pt will decrease fascial restrictions from max to mod amount.    Status On-going   OT SHORT TERM GOAL #4   Title Pt will increase PROM to greatest range within protocol limits.    Status On-going   OT SHORT TERM GOAL #5   Title Pt will increase strength to 3/5 to increase ability to donn shirts.    Status On-going           OT Long Term Goals - 11/11/14 1258    OT LONG TERM GOAL #1   Title Pt will return to previous level of function and independence in daily activities.    Status On-going   OT LONG TERM GOAL #2   Title Pt will decrease pain to 1/10 or less during daily tasks.    Status On-going   OT LONG TERM GOAL #3   Title Pt will decrease fascial restrictions from mod to min amounts or less.    Status On-going   OT LONG TERM GOAL #4   Title Pt will increase AROM to [facility] to increase ability to reach into overhead cabinets.    Status On-going   OT LONG TERM GOAL #5   Title Pt will increase strength to 4/5 to increase ability to lift items when working.    Status On-going               Plan - 11/19/14 1447    Clinical Impression Statement A: Increased therapy ball repetitions to 15, pulleys to 1'30". Pt reports he has had a very busy weekend and did not complete HEP, he is also sore with movement this afternoon. Did not add AAROM seated due to increased soreness & fatigue. Pt tolerated treatment well, and reports he will try to complete his HEP this afternoon.    Plan P: continue to follow protocol. Add AAROM seated if appropriate.         Problem List Patient  Active Problem List   Diagnosis Date Noted  . 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 Encounter for therapeutic drug monitoring 05/11/2013  . Hypertension   . Hyperlipidemia   . Chronic anticoagulation   . Degenerative joint disease   . Obesity   . TOBACCO ABUSE 03/18/2009  . Atrial  fibrillation 03/18/2009    Ezra Sites, OTR/L  [phone]  11/19/2014, 2:50 PM  [facility] [address] [place], [place], [address] Phone: [phone]   Fax:  [phone]

## 2014-11-20 ENCOUNTER — Encounter (HOSPITAL_COMMUNITY): Payer: Self-pay | Admitting: Occupational Therapy

## 2014-11-20 ENCOUNTER — Ambulatory Visit (HOSPITAL_COMMUNITY): Payer: BLUE CROSS/BLUE SHIELD | Admitting: Occupational Therapy

## 2014-11-20 DIAGNOSIS — M25512 Pain in left shoulder: Secondary | ICD-10-CM

## 2014-11-20 DIAGNOSIS — M6289 Other specified disorders of muscle: Secondary | ICD-10-CM

## 2014-11-20 DIAGNOSIS — M25612 Stiffness of left shoulder, not elsewhere classified: Secondary | ICD-10-CM

## 2014-11-20 DIAGNOSIS — R29898 Other symptoms and signs involving the musculoskeletal system: Secondary | ICD-10-CM

## 2014-11-20 DIAGNOSIS — M629 Disorder of muscle, unspecified: Secondary | ICD-10-CM

## 2014-11-20 NOTE — Therapy (Signed)
[facility] [address] [place], [place], [address] Phone: [phone]   Fax:  [phone]  Occupational Therapy Treatment  Patient Details  Name: Tony [name] MRN: [id number] Date of Birth: 1950/10/27 Referring Provider:  Francena Hanly, MD  Encounter Date: 11/20/2014      OT End of Session - 11/20/14 1510    Visit Number 6   Number of Visits 24   Date for OT Re-Evaluation 01/07/15  mini-reassess 12/06/2014   Authorization Type BC[employer]   Authorization Time Period Pt has visit limit of 30, has used 8 as of 11/08/14   Authorization - Visit Number 14   Authorization - Number of Visits 30   OT Start Time 1349   OT Stop Time 1430   OT Time Calculation (min) 41 min   Activity Tolerance Patient tolerated treatment well   Behavior During Therapy [facility] for tasks assessed/performed      Past Medical History  Diagnosis Date  . Hypertension   . Hyperlipidemia   . Atrial fibrillation     unknown onset  . Chronic anticoagulation   . Tobacco abuse   . Degenerative joint disease     Left TKA; right shoulder surgery also advised  . Obesity   . Dysrhythmia   . COPD (chronic obstructive pulmonary disease)   . Shortness of breath dyspnea   . Sleep apnea     Past Surgical History  Procedure Laterality Date  . Total knee arthroplasty      Left  . Joint replacement    . Shoulder surgery    . 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  Visit Diagnosis:  Shoulder pain, left  Left arm weakness  Tight fascia  Shoulder joint stiffness, left  Decreased range of motion of shoulder, left      Subjective Assessment - 11/20/14 1354    Subjective  S: I'm doing ok, a little sore but I took tylenol before coming.    Currently in Pain? Yes   Pain Score 2     Pain Location Shoulder   Pain Orientation Left   Pain Descriptors / Indicators Aching   Pain Type Surgical pain            [facility] OT Assessment - 11/19/14 1346    Assessment   Diagnosis Left reverse TSA   Precautions   Precautions Shoulder   Type of Shoulder Precautions See Protocol: 0-4 weeks (6/30-7/28): PROM-ER to 30 degrees, Abduction to 60 degrees, flexion to 90 degrees; pendulum exercises; AAROM w/in previous measurements. Weeks 4-8 (7/28-8/25): Pulleys at week 4, PROM-ER to 45 degrees, Abduction to 90 degrees, flexion to 115 degrees. DO NOT AGGRESSIVELY PUSH ROM. Weeks 8-12 (8/25-9/22): Progress PROM/AROM as tolerated, theraband ex. Week 12 (9/22): strengthening as tolerated.                   OT Treatments/Exercises (OP) - 11/20/14 1355    Exercises   Exercises Shoulder;Elbow   Shoulder Exercises: Supine   Protraction PROM;5 reps;AAROM;10 reps   Horizontal ABduction PROM;5 reps;AAROM;10 reps   External Rotation PROM;5 reps;AAROM;10 reps   External Rotation Limitations within protocol    Internal Rotation PROM;5 reps;AAROM;10 reps   Internal Rotation Limitations within protocol   Flexion PROM;5 reps;AAROM;10 reps   Flexion Limitations within protocol  ABduction PROM;5 reps;AAROM;10 reps   ABduction Limitations within protocol   Shoulder Exercises: Seated   Elevation AROM;15 reps   Extension AROM;15 reps   Row AROM;15 reps   Protraction AAROM;5 reps   Horizontal ABduction AAROM;5 reps   External Rotation AAROM;5 reps   External Rotation Limitations within protocol limits    Internal Rotation AAROM   Internal Rotation Limitations within protocol limits   Flexion AAROM;5 reps   Flexion Limitations within protocol limits   Abduction AAROM;5 reps   ABduction Limitations within protocol limits   Other Seated Exercises "Hitchhiker" and "upper cut" exercise; 15X   Shoulder Exercises: Therapy Ball   Flexion 15 reps   Flexion Limitations within protocol    ABduction 15 reps   ABduction Limitations within protocol   Shoulder Exercises: ROM/Strengthening   Wall Wash 1'  within protocol   Elbow Exercises   Elbow Extension AROM;15 reps   Forearm Supination AROM;15 reps   Forearm Pronation AROM;15 reps   Other elbow exercises elbow flexion AROM 15X   Manual Therapy   Manual Therapy Myofascial release   Myofascial Release Myofascial release to left upper arm, trapezius, and scapularis region to decrease fascial restrictions and increase joint mobility in a pain free zone.                   OT Short Term Goals - 11/11/14 1257    OT SHORT TERM GOAL #1   Title Pt will be educated on and independent in HEP.    Status On-going   OT SHORT TERM GOAL #2   Title Pt will decrease pain to 3/10 or less during daily tasks.    Status On-going   OT SHORT TERM GOAL #3   Title Pt will decrease fascial restrictions from max to mod amount.    Status On-going   OT SHORT TERM GOAL #4   Title Pt will increase PROM to greatest range within protocol limits.    Status On-going   OT SHORT TERM GOAL #5   Title Pt will increase strength to 3/5 to increase ability to donn shirts.    Status On-going           OT Long Term Goals - 11/11/14 1258    OT LONG TERM GOAL #1   Title Pt will return to previous level of function and independence in daily activities.    Status On-going   OT LONG TERM GOAL #2   Title Pt will decrease pain to 1/10 or less during daily tasks.    Status On-going   OT LONG TERM GOAL #3   Title Pt will decrease fascial restrictions from mod to min amounts or less.    Status On-going   OT LONG TERM GOAL #4   Title Pt will increase AROM to [facility] to increase ability to reach into overhead cabinets.    Status On-going   OT LONG TERM GOAL #5   Title Pt will increase strength to 4/5 to increase ability to lift items when working.    Status On-going               Plan - 11/20/14 1603    Clinical Impression Statement A:  Added AAROM seated & wall wash, staying within defined protocol limits. Did not complete pulleys due to time constraints. Pt reports soreness, however states it is no worse than yesterday. Pt did take tylenol prior to coming to therapy today.    Plan P: continue to follow protocol, add  low thumb tacks.         Problem List Patient Active Problem List   Diagnosis Date Noted  . 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 Encounter for therapeutic drug monitoring 05/11/2013  . Hypertension   . Hyperlipidemia   . Chronic anticoagulation   . Degenerative joint disease   . Obesity   . TOBACCO ABUSE 03/18/2009  . Atrial fibrillation 03/18/2009    Ezra Sites, OTR/L  [phone]  11/20/2014, 4:07 PM  [facility] [address] [place], [place], [address] Phone: [phone]   Fax:  [phone]

## 2014-11-22 ENCOUNTER — Ambulatory Visit (HOSPITAL_COMMUNITY): Payer: BLUE CROSS/BLUE SHIELD

## 2014-11-22 ENCOUNTER — Encounter (HOSPITAL_COMMUNITY): Payer: Self-pay

## 2014-11-22 DIAGNOSIS — M25612 Stiffness of left shoulder, not elsewhere classified: Secondary | ICD-10-CM

## 2014-11-22 DIAGNOSIS — M6289 Other specified disorders of muscle: Secondary | ICD-10-CM

## 2014-11-22 DIAGNOSIS — M25512 Pain in left shoulder: Secondary | ICD-10-CM

## 2014-11-22 DIAGNOSIS — R29898 Other symptoms and signs involving the musculoskeletal system: Secondary | ICD-10-CM

## 2014-11-22 DIAGNOSIS — M629 Disorder of muscle, unspecified: Secondary | ICD-10-CM

## 2014-11-22 NOTE — Therapy (Signed)
[facility] [address] [place], [place], [address] Phone: [phone]   Fax:  [phone]  Occupational Therapy Treatment  Patient Details  Name: Tony [name] MRN: [id number] Date of Birth: 09-29-1950 Referring Provider:  Francena Hanly, MD  Encounter Date: 11/22/2014      OT End of Session - 11/22/14 1407    Visit Number 7   Number of Visits 24   Date for OT Re-Evaluation 01/07/15  mini-reassess 12/06/2014   Authorization Type BC[employer]   Authorization Time Period Pt has visit limit of 30, has used 8 as of 11/08/14   Authorization - Visit Number 15   Authorization - Number of Visits 30   OT Start Time 1305   OT Stop Time 1345   OT Time Calculation (min) 40 min   Activity Tolerance Patient tolerated treatment well   Behavior During Therapy [facility] for tasks assessed/performed      Past Medical History  Diagnosis Date  . Hypertension   . Hyperlipidemia   . Atrial fibrillation     unknown onset  . Chronic anticoagulation   . Tobacco abuse   . Degenerative joint disease     Left TKA; right shoulder surgery also advised  . Obesity   . Dysrhythmia   . COPD (chronic obstructive pulmonary disease)   . Shortness of breath dyspnea   . Sleep apnea     Past Surgical History  Procedure Laterality Date  . Total knee arthroplasty      Left  . Joint replacement    . Shoulder surgery    . 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  Visit Diagnosis:  Shoulder pain, left  Left arm weakness  Tight fascia  Shoulder joint stiffness, left          [facility] OT Assessment - 11/22/14 1332    Assessment   Diagnosis Left reverse TSA   Precautions   Precautions Shoulder   Type of Shoulder Precautions See Protocol: 0-4 weeks (6/30-7/28): PROM-ER to 30  degrees, Abduction to 60 degrees, flexion to 90 degrees; pendulum exercises; AAROM w/in previous measurements. Weeks 4-8 (7/28-8/25): Pulleys at week 4, PROM-ER to 45 degrees, Abduction to 90 degrees, flexion to 115 degrees. DO NOT AGGRESSIVELY PUSH ROM. Weeks 8-12 (8/25-9/22): Progress PROM/AROM as tolerated, theraband ex. Week 12 (9/22): strengthening as tolerated.                   OT Treatments/Exercises (OP) - 11/22/14 1334    Exercises   Exercises Shoulder;Elbow   Shoulder Exercises: Supine   Protraction PROM;5 reps;AAROM;10 reps   Horizontal ABduction PROM;5 reps;AAROM;10 reps   External Rotation PROM;5 reps;AAROM;10 reps   External Rotation Limitations within protocol    Internal Rotation PROM;5 reps;AAROM;10 reps   Internal Rotation Limitations within protocol   Flexion PROM;5 reps;AAROM;10 reps   Flexion Limitations within protocol   ABduction PROM;5 reps;AAROM;10 reps   ABduction Limitations within protocol   Shoulder Exercises: Seated   Extension AROM;15 reps   Shoulder Exercises: Pulleys   Flexion 1 minute;Limitations   Flexion Limitations Staying within set protocol   ABduction 1 minute;Limitations   ABduction Limitations Staying within set protocol.   Shoulder Exercises: ROM/Strengthening   Thumb Tacks 30"   Proximal Shoulder Strengthening, Supine 10X no rest break  Manual Therapy   Manual Therapy Myofascial release   Myofascial Release Myofascial release to left upper arm, trapezius, and scapularis region to decrease fascial restrictions and increase joint mobility in a pain free zone.                   OT Short Term Goals - 11/11/14 1257    OT SHORT TERM GOAL #1   Title Pt will be educated on and independent in HEP.    Status On-going   OT SHORT TERM GOAL #2   Title Pt will decrease pain to 3/10 or less during daily tasks.    Status On-going   OT SHORT TERM GOAL #3   Title Pt will decrease fascial restrictions from max to mod amount.     Status On-going   OT SHORT TERM GOAL #4   Title Pt will increase PROM to greatest range within protocol limits.    Status On-going   OT SHORT TERM GOAL #5   Title Pt will increase strength to 3/5 to increase ability to donn shirts.    Status On-going           OT Long Term Goals - 11/11/14 1258    OT LONG TERM GOAL #1   Title Pt will return to previous level of function and independence in daily activities.    Status On-going   OT LONG TERM GOAL #2   Title Pt will decrease pain to 1/10 or less during daily tasks.    Status On-going   OT LONG TERM GOAL #3   Title Pt will decrease fascial restrictions from mod to min amounts or less.    Status On-going   OT LONG TERM GOAL #4   Title Pt will increase AROM to [facility] to increase ability to reach into overhead cabinets.    Status On-going   OT LONG TERM GOAL #5   Title Pt will increase strength to 4/5 to increase ability to lift items when working.    Status On-going               Plan - 11/22/14 1407    Clinical Impression Statement A: Added proximal shoulder strengthening supine and thumb tacks. Patient able to tolerate only 30 seconds of thumb tacks. Pt reports continued soreness since starting newer exercises.    Plan P: Cont to follow thumb tacks. Progress to 1' thumb tacks as able. Cont with missed exercises.         Problem List Patient Active Problem List   Diagnosis Date Noted  . 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 Encounter for therapeutic drug monitoring 05/11/2013  . Hypertension   . Hyperlipidemia   . Chronic anticoagulation   . Degenerative joint disease   . Obesity   . TOBACCO ABUSE 03/18/2009  . Atrial fibrillation 03/18/2009    Limmie Patricia, OTR/L,CBIS  [phone]  11/22/2014, 2:09 PM  [facility] [place] [address] [place], [place], [address] Phone: [phone]   Fax:  [phone]

## 2014-11-25 ENCOUNTER — Encounter (HOSPITAL_COMMUNITY): Payer: BLUE CROSS/BLUE SHIELD

## 2014-11-25 ENCOUNTER — Ambulatory Visit (INDEPENDENT_AMBULATORY_CARE_PROVIDER_SITE_OTHER): Payer: BLUE CROSS/BLUE SHIELD | Admitting: *Deleted

## 2014-11-25 ENCOUNTER — Telehealth (HOSPITAL_COMMUNITY): Payer: Self-pay

## 2014-11-25 DIAGNOSIS — Z5181 Encounter for therapeutic drug level monitoring: Secondary | ICD-10-CM

## 2014-11-25 DIAGNOSIS — I4891 Unspecified atrial fibrillation: Secondary | ICD-10-CM | POA: Diagnosis not present

## 2014-11-25 LAB — POCT INR: INR: 4.2

## 2014-11-25 NOTE — Telephone Encounter (Signed)
Patient had another appt  °

## 2014-11-28 ENCOUNTER — Ambulatory Visit (HOSPITAL_COMMUNITY): Payer: BLUE CROSS/BLUE SHIELD

## 2014-11-28 ENCOUNTER — Encounter (HOSPITAL_COMMUNITY): Payer: Self-pay

## 2014-11-28 DIAGNOSIS — M6289 Other specified disorders of muscle: Secondary | ICD-10-CM

## 2014-11-28 DIAGNOSIS — M25512 Pain in left shoulder: Secondary | ICD-10-CM | POA: Diagnosis not present

## 2014-11-28 DIAGNOSIS — M25612 Stiffness of left shoulder, not elsewhere classified: Secondary | ICD-10-CM

## 2014-11-28 DIAGNOSIS — R29898 Other symptoms and signs involving the musculoskeletal system: Secondary | ICD-10-CM

## 2014-11-28 DIAGNOSIS — M629 Disorder of muscle, unspecified: Secondary | ICD-10-CM

## 2014-11-28 NOTE — Therapy (Signed)
[facility] [address] [place], [place], [address] Phone: [phone]   Fax:  [phone]  Occupational Therapy Treatment  Patient Details  Name: Tony [name] MRN: [id number] Date of Birth: Aug 20, 1950 Referring Provider:  Francena Hanly, MD  Encounter Date: 11/28/2014      OT End of Session - 11/28/14 1256    Visit Number 8   Number of Visits 24   Date for OT Re-Evaluation 01/07/15  mini-reassess 12/06/2014   Authorization Type [employer]   Authorization Time Period Pt has visit limit of 30, has used 8 as of 11/08/14   Authorization - Visit Number 16   Authorization - Number of Visits 30   OT Start Time 1148   OT Stop Time 1230   OT Time Calculation (min) 42 min   Activity Tolerance Patient tolerated treatment well   Behavior During Therapy [facility] for tasks assessed/performed      Past Medical History  Diagnosis Date  . Hypertension   . Hyperlipidemia   . Atrial fibrillation     unknown onset  . Chronic anticoagulation   . Tobacco abuse   . Degenerative joint disease     Left TKA; right shoulder surgery also advised  . Obesity   . Dysrhythmia   . COPD (chronic obstructive pulmonary disease)   . Shortness of breath dyspnea   . Sleep apnea     Past Surgical History  Procedure Laterality Date  . Total knee arthroplasty      Left  . Joint replacement    . Shoulder surgery    . 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  Visit Diagnosis:  Shoulder pain, left  Left arm weakness  Tight fascia  Shoulder joint stiffness, left          [facility] OT Assessment - 11/28/14 1209    Assessment   Diagnosis Left reverse TSA   Precautions   Precautions Shoulder   Type of Shoulder Precautions See Protocol: 0-4 weeks (6/30-7/28): PROM-ER to 30  degrees, Abduction to 60 degrees, flexion to 90 degrees; pendulum exercises; AAROM w/in previous measurements. Weeks 4-8 (7/28-8/25): Pulleys at week 4, PROM-ER to 45 degrees, Abduction to 90 degrees, flexion to 115 degrees. DO NOT AGGRESSIVELY PUSH ROM. Weeks 8-12 (8/25-9/22): Progress PROM/AROM as tolerated, theraband ex. Week 12 (9/22): strengthening as tolerated.                   OT Treatments/Exercises (OP) - 11/28/14 1209    Exercises   Exercises Shoulder;Elbow   Shoulder Exercises: Supine   Protraction PROM;5 reps;AAROM;10 reps   Horizontal ABduction PROM;5 reps;AAROM;10 reps   External Rotation PROM;5 reps;AAROM;10 reps   External Rotation Limitations within protocol    Internal Rotation PROM;5 reps;AAROM;10 reps   Internal Rotation Limitations within protocol   Flexion PROM;5 reps;AAROM;10 reps   Flexion Limitations within protocol   ABduction PROM;5 reps;AAROM;10 reps   Shoulder Exercises: Seated   Elevation AROM;10 reps   Extension AROM;10 reps   Row AROM;10 reps   Protraction AAROM;10 reps   Horizontal ABduction AROM;10 reps   External Rotation AROM;10 reps   External Rotation Limitations within protocol limits    Internal Rotation AROM;10 reps   Internal Rotation Limitations within protocol limits   Flexion AAROM;10 reps   Flexion Limitations  within protocol limits   Abduction AROM;10 reps   ABduction Limitations within protocol limits   Shoulder Exercises: Pulleys   Flexion 1 minute;Limitations   Flexion Limitations Staying within set protocol   ABduction 1 minute;Limitations   ABduction Limitations Staying within set protocol.   Shoulder Exercises: ROM/Strengthening   Wall Wash 1'   Thumb Tacks 40"   Proximal Shoulder Strengthening, Supine 10X no rest break   Manual Therapy   Manual Therapy Myofascial release   Myofascial Release Myofascial release to left upper arm, trapezius, and scapularis region to decrease fascial restrictions and increase  joint mobility in a pain free zone.                   OT Short Term Goals - 11/11/14 1257    OT SHORT TERM GOAL #1   Title Pt will be educated on and independent in HEP.    Status On-going   OT SHORT TERM GOAL #2   Title Pt will decrease pain to 3/10 or less during daily tasks.    Status On-going   OT SHORT TERM GOAL #3   Title Pt will decrease fascial restrictions from max to mod amount.    Status On-going   OT SHORT TERM GOAL #4   Title Pt will increase PROM to greatest range within protocol limits.    Status On-going   OT SHORT TERM GOAL #5   Title Pt will increase strength to 3/5 to increase ability to donn shirts.    Status On-going           OT Long Term Goals - 11/11/14 1258    OT LONG TERM GOAL #1   Title Pt will return to previous level of function and independence in daily activities.    Status On-going   OT LONG TERM GOAL #2   Title Pt will decrease pain to 1/10 or less during daily tasks.    Status On-going   OT LONG TERM GOAL #3   Title Pt will decrease fascial restrictions from mod to min amounts or less.    Status On-going   OT LONG TERM GOAL #4   Title Pt will increase AROM to [facility] to increase ability to reach into overhead cabinets.    Status On-going   OT LONG TERM GOAL #5   Title Pt will increase strength to 4/5 to increase ability to lift items when working.    Status On-going               Plan - 11/28/14 1256    Clinical Impression Statement A: Completed some AROM standing as using the dowel rod was painful for RUE. Pt arrived to clinic without sling. Pt saw MD and sling was taken away. Pt reports that he is not able to lift anything heavier than 3lbs.   Plan P: Sent message to MD asking if we need to remain within protocol ROM or if we're able to go full ROM.        Problem List Patient Active Problem List   Diagnosis Date Noted  . 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 Encounter for therapeutic drug monitoring 05/11/2013   . Hypertension   . Hyperlipidemia   . Chronic anticoagulation   . Degenerative joint disease   . Obesity   . TOBACCO ABUSE 03/18/2009  . Atrial fibrillation 03/18/2009    Limmie Patricia, OTR/L,CBIS  [phone]   11/28/2014, 12:59 PM  [facility] [address]  [place], [place], [address] Phone: [phone]   Fax:  [phone]

## 2014-12-03 ENCOUNTER — Ambulatory Visit (HOSPITAL_COMMUNITY): Payer: BLUE CROSS/BLUE SHIELD | Admitting: Occupational Therapy

## 2014-12-03 ENCOUNTER — Encounter (HOSPITAL_COMMUNITY): Payer: Self-pay | Admitting: Occupational Therapy

## 2014-12-03 DIAGNOSIS — M25512 Pain in left shoulder: Secondary | ICD-10-CM | POA: Diagnosis not present

## 2014-12-03 DIAGNOSIS — M6289 Other specified disorders of muscle: Secondary | ICD-10-CM

## 2014-12-03 DIAGNOSIS — M629 Disorder of muscle, unspecified: Secondary | ICD-10-CM

## 2014-12-03 DIAGNOSIS — M25612 Stiffness of left shoulder, not elsewhere classified: Secondary | ICD-10-CM

## 2014-12-03 DIAGNOSIS — R29898 Other symptoms and signs involving the musculoskeletal system: Secondary | ICD-10-CM

## 2014-12-03 NOTE — Therapy (Signed)
[facility] [address] [place], [place], [address] Phone: [phone]   Fax:  [phone]  Occupational Therapy Treatment  Patient Details  Name: Tony [name] MRN: [id number] Date of Birth: 08/23/1950 Referring Provider:  Assunta Found, MD  Encounter Date: 12/03/2014      OT End of Session - 12/03/14 1544    Visit Number 9   Number of Visits 24   Date for OT Re-Evaluation 01/07/15  mini-reassess 12/06/2014   Authorization Type BCBS   Authorization Time Period Pt has visit limit of 30, has used 8 as of 11/08/14   Authorization - Visit Number 17   Authorization - Number of Visits 30   OT Start Time 1352   OT Stop Time 1430   OT Time Calculation (min) 38 min   Activity Tolerance Patient tolerated treatment well   Behavior During Therapy [facility] for tasks assessed/performed      Past Medical History  Diagnosis Date  . Hypertension   . Hyperlipidemia   . Atrial fibrillation     unknown onset  . Chronic anticoagulation   . Tobacco abuse   . Degenerative joint disease     Left TKA; right shoulder surgery also advised  . Obesity   . Dysrhythmia   . COPD (chronic obstructive pulmonary disease)   . Shortness of breath dyspnea   . Sleep apnea     Past Surgical History  Procedure Laterality Date  . Total knee arthroplasty      Left  . Joint replacement    . Shoulder surgery    . 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  Visit Diagnosis:  Shoulder pain, left  Left arm weakness  Tight fascia  Shoulder joint stiffness, left  Decreased range of motion of shoulder, left      Subjective Assessment - 12/03/14 1543    Subjective  S: I'm a little sore, I've been using it too much though.    Currently in Pain? No/denies             [facility] OT Assessment - 12/03/14 1356    Assessment   Diagnosis Left reverse TSA   Precautions   Precautions Shoulder   Type of Shoulder Precautions See Protocol: 0-4 weeks (6/30-7/28): PROM-ER to 30 degrees, Abduction to 60 degrees, flexion to 90 degrees; pendulum exercises; AAROM w/in previous measurements. Weeks 4-8 (7/28-8/25): Pulleys at week 4, PROM-ER to 45 degrees, Abduction to 90 degrees, flexion to 115 degrees. DO NOT AGGRESSIVELY PUSH ROM. Weeks 8-12 (8/25-9/22): Progress PROM/AROM as tolerated, theraband ex. Week 12 (9/22): strengthening as tolerated.                   OT Treatments/Exercises (OP) - 12/03/14 1419    Exercises   Exercises Shoulder;Elbow   Shoulder Exercises: Supine   Protraction PROM;5 reps;AAROM;12 reps   Horizontal ABduction PROM;5 reps;AAROM;12 reps   External Rotation PROM;5 reps;AAROM;12 reps   External Rotation Limitations within protocol    Internal Rotation PROM;5 reps;AAROM;12 reps   Internal Rotation Limitations within protocol   Flexion PROM;5 reps;AAROM;12 reps   Flexion Limitations within protocol   ABduction PROM;5 reps;AAROM;12 reps   ABduction Limitations within protocol   Shoulder Exercises: Pulleys   Flexion 1 minute;Limitations   Flexion Limitations Staying within set protocol  ABduction 1 minute;Limitations   ABduction Limitations Staying within set protocol.   Shoulder Exercises: ROM/Strengthening   Thumb Tacks 1'   Proximal Shoulder Strengthening, Supine 10X no rest break   Manual Therapy   Manual Therapy Myofascial release   Myofascial Release Myofascial release to left upper arm, trapezius, and scapularis region to decrease fascial restrictions and increase joint mobility in a pain free zone.                   OT Short Term Goals - 11/11/14 1257    OT SHORT TERM GOAL #1   Title Pt will be educated on and independent in HEP.    Status On-going   OT SHORT TERM GOAL #2   Title Pt will decrease pain to 3/10  or less during daily tasks.    Status On-going   OT SHORT TERM GOAL #3   Title Pt will decrease fascial restrictions from max to mod amount.    Status On-going   OT SHORT TERM GOAL #4   Title Pt will increase PROM to greatest range within protocol limits.    Status On-going   OT SHORT TERM GOAL #5   Title Pt will increase strength to 3/5 to increase ability to donn shirts.    Status On-going           OT Long Term Goals - 11/11/14 1258    OT LONG TERM GOAL #1   Title Pt will return to previous level of function and independence in daily activities.    Status On-going   OT LONG TERM GOAL #2   Title Pt will decrease pain to 1/10 or less during daily tasks.    Status On-going   OT LONG TERM GOAL #3   Title Pt will decrease fascial restrictions from mod to min amounts or less.    Status On-going   OT LONG TERM GOAL #4   Title Pt will increase AROM to [facility] to increase ability to reach into overhead cabinets.    Status On-going   OT LONG TERM GOAL #5   Title Pt will increase strength to 4/5 to increase ability to lift items when working.    Status On-going               Plan - 12/03/14 1545    Clinical Impression Statement A: Increased  AAROM exercises to 12 repetitions in supine, increased thumb tacks to 1'. Pt tolerated treatment well. Waiting to hear from MD if we can begin full ROM during exercises. Pt reports soreness due to using his arm a lot recently.    Plan P: Follow-up on MD reply (see Vernona Rieger). Mini-reassess        Problem List Patient Active Problem List   Diagnosis Date Noted  . 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 Encounter for therapeutic drug monitoring 05/11/2013  . Hypertension   . Hyperlipidemia   . Chronic anticoagulation   . Degenerative joint disease   . Obesity   . TOBACCO ABUSE 03/18/2009  . Atrial fibrillation 03/18/2009    Ezra Sites, OTR/L  [phone]  12/03/2014, 3:48 PM  [facility] [address] [place], [place], [address] Phone: [phone]   Fax:  [phone]

## 2014-12-04 ENCOUNTER — Encounter (HOSPITAL_COMMUNITY): Payer: Self-pay

## 2014-12-04 NOTE — Therapy (Signed)
[facility] [address] [place], [place], [address] Phone: [phone]   Fax:  [phone]  Patient Details  Name: Tony [name] MRN: [id number] Date of Birth: 09/18/1950 Referring Provider:  No ref. provider found  Encounter Date: 12/04/2014 Spoke with Tresa Endo at [facility] regarding protocol for [name]. Per Tresa Endo, we are allowed to start AROM and progress ROM as patient is able to tolerate.   Limmie Patricia, OTR/L,CBIS  [phone]  12/04/2014, 4:07 PM  [facility] [address] [place], [place], [address] Phone: [phone]   Fax:  [phone]

## 2014-12-05 ENCOUNTER — Ambulatory Visit (HOSPITAL_COMMUNITY): Payer: BLUE CROSS/BLUE SHIELD

## 2014-12-05 ENCOUNTER — Encounter (HOSPITAL_COMMUNITY): Payer: Self-pay

## 2014-12-05 DIAGNOSIS — M25512 Pain in left shoulder: Secondary | ICD-10-CM | POA: Diagnosis not present

## 2014-12-05 DIAGNOSIS — M629 Disorder of muscle, unspecified: Secondary | ICD-10-CM

## 2014-12-05 DIAGNOSIS — M25612 Stiffness of left shoulder, not elsewhere classified: Secondary | ICD-10-CM

## 2014-12-05 DIAGNOSIS — M6289 Other specified disorders of muscle: Secondary | ICD-10-CM

## 2014-12-05 DIAGNOSIS — R29898 Other symptoms and signs involving the musculoskeletal system: Secondary | ICD-10-CM

## 2014-12-05 NOTE — Therapy (Signed)
[place], [place], [address] Phone: [phone]   Fax:  [phone]  Occupational Therapy Treatment  Patient Details  Name: Tony [name] MRN: [id number] Date of Birth: 06/05/1950 Referring Provider:  Justice Britain, MD  Encounter Date: 12/05/2014      OT End of Session - 12/05/14 1344    Visit Number 10   Number of Visits 24   Date for OT Re-Evaluation 01/07/15   Authorization Type BCBS   Authorization Time Period Pt has visit limit of 30, has used 8 as of 11/08/14   Authorization - Visit Number 18   Authorization - Number of Visits 30   OT Start Time 1300   OT Stop Time 1345   OT Time Calculation (min) 45 min   Activity Tolerance Patient tolerated treatment well   Behavior During Therapy [facility] for tasks assessed/performed      Past Medical History  Diagnosis Date  . Hypertension   . Hyperlipidemia   . Atrial fibrillation     unknown onset  . Chronic anticoagulation   . Tobacco abuse   . Degenerative joint disease     Left TKA; right shoulder surgery also advised  . Obesity   . Dysrhythmia   . COPD (chronic obstructive pulmonary disease)   . Shortness of breath dyspnea   . Sleep apnea     Past Surgical History  Procedure Laterality Date  . Total knee arthroplasty      Left  . Joint replacement    . Shoulder surgery    . Reverse shoulder arthroplasty Left 10/17/2014    Procedure: LEFT REVERSE SHOULDER ARTHROPLASTY;  Surgeon: Justice Britain, MD;  Location: [facility];  Service: Orthopedics;  Laterality: Left;    There were no vitals filed for this visit.  Visit Diagnosis:  Shoulder pain, left  Left arm weakness  Tight fascia  Shoulder joint stiffness, left      Subjective Assessment - 12/05/14 1304    Subjective  S: I"m feeling sore today.    Currently in Pain? Yes   Pain Score 5    Pain Location Shoulder   Pain Orientation Left   Pain Descriptors / Indicators Sore   Pain Type  Surgical pain            [facility] OT Assessment - 12/05/14 1305    Assessment   Diagnosis Left reverse TSA   Precautions   Precautions Shoulder   Type of Shoulder Precautions Per MD: Begin ROM full range. Progress as tolerated.    AROM   Overall AROM Comments Assessed in supine, ER/IR adducted   AROM Assessment Site Shoulder   Right/Left Shoulder Left   Left Shoulder Flexion 151 Degrees  eval: 90   Left Shoulder ABduction 125 Degrees  eval: 57   Left Shoulder Internal Rotation 90 Degrees  same at eval   Left Shoulder External Rotation 20 Degrees  eval: 0   PROM   Overall PROM Comments Assessed in supine, ER/IR adducted    PROM Assessment Site Shoulder   Right/Left Shoulder Left   Left Shoulder Flexion 132 Degrees  eval: 118   Left Shoulder ABduction 180 Degrees  eval: 70   Left Shoulder Internal Rotation 90 Degrees  same at eval   Left Shoulder External Rotation 45 Degrees  eval: -13   Strength   Overall Strength Unable to assess;Due to pain;Due to precautions   Strength Assessment Site Shoulder  OT Treatments/Exercises (OP) - 12/05/14 1336    Exercises   Exercises Shoulder;Elbow   Shoulder Exercises: Supine   Protraction PROM;5 reps   Horizontal ABduction PROM;5 reps   External Rotation PROM;5 reps   Internal Rotation PROM;5 reps   Flexion PROM;5 reps   ABduction PROM;5 reps   Shoulder Exercises: Seated   Protraction AROM;10 reps   Horizontal ABduction AROM;10 reps   External Rotation AROM;10 reps   Internal Rotation AROM;10 reps   Flexion AROM;10 reps   Abduction AROM;10 reps   Manual Therapy   Manual Therapy Myofascial release   Myofascial Release Myofascial release to left upper arm, trapezius, and scapularis region to decrease fascial restrictions and increase joint mobility in a pain free zone.                 OT Education - 12/05/14 1343    Education provided Yes   Education Details AROM exercises. reviewed  goals.    Person(s) Educated Patient   Methods Explanation;Demonstration;Handout   Comprehension Verbalized understanding;Returned demonstration          OT Short Term Goals - 12/05/14 1329    OT SHORT TERM GOAL #1   Title Pt will be educated on and independent in HEP.    Status On-going   OT SHORT TERM GOAL #2   Title Pt will decrease pain to 3/10 or less during daily tasks.    Status Achieved   OT SHORT TERM GOAL #3   Title Pt will decrease fascial restrictions from max to mod amount.    Status On-going   OT SHORT TERM GOAL #4   Title Pt will increase PROM to greatest range within protocol limits.    Status Achieved   OT SHORT TERM GOAL #5   Title Pt will increase strength to 3/5 to increase ability to donn shirts.    Status Achieved           OT Long Term Goals - 12/05/14 1333    OT LONG TERM GOAL #1   Title Pt will return to previous level of function and independence in daily activities.    Status On-going   OT LONG TERM GOAL #2   Title Pt will decrease pain to 1/10 or less during daily tasks.    Status On-going   OT LONG TERM GOAL #3   Title Pt will decrease fascial restrictions from mod to min amounts or less.    Status On-going   OT LONG TERM GOAL #4   Title Pt will increase AROM to [facility] to increase ability to reach into overhead cabinets.    Status On-going   OT LONG TERM GOAL #5   Title Pt will increase strength to 4/5 to increase ability to lift items while completing household tasks.   Status On-going               Plan - 12/05/14 1347    Clinical Impression Statement A: Mini reassessment completed this date. patient has met 3/5 STGs and is progressing towards remaining therapy goals. Patieng reports that he is able to complete activities below shoulder level with less difficulty. Continues to have increased difficulty with raising arm above shoulder level. Updated HEoto include AROM exercises. Pt unable to complete AAROM with dowel due to Right  shoulder issues and pain. MD has given the ok to progress as able to tolerate.    Plan P: Follow up on AROM HEP. Complete AROM supine.  Problem List Patient Active Problem List   Diagnosis Date Noted  . 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 Encounter for therapeutic drug monitoring 05/11/2013  . Hypertension   . Hyperlipidemia   . Chronic anticoagulation   . Degenerative joint disease   . Obesity   . TOBACCO ABUSE 03/18/2009  . Atrial fibrillation 03/18/2009    Ailene Ravel, OTR/L,CBIS  [phone]  12/05/2014, 2:16 PM  [place] [address] [place], [place], [address] Phone: [phone]   Fax:  [phone]

## 2014-12-05 NOTE — Patient Instructions (Signed)
Complete each exercise 10 times.   1) Shoulder Protraction    Begin with elbows by your side, slowly "punch" straight out in front of you.    2) Shoulder Flexion  Supine:     Standing:         Begin with arms at your side with thumbs pointed up, slowly raise both arms up and forward towards overhead.       3) Horizontal abduction/adduction  Supine:   Standing:           Begin with arms straight out in front of you, bring out to the side in at "T" shape. Keep arms straight entire time.     4) Internal & External Rotation    *No band* -Stand with elbows at the side and elbows bent 90 degrees. Move your forearms away from your body, then bring back inward toward the body.     5) Shoulder Abduction  Supine:     Standing:       Lying on your back begin with your arms flat on the table next to your side. Slowly move your arms out to the side so that they go overhead, in a jumping jack or snow angel movement.

## 2014-12-09 ENCOUNTER — Ambulatory Visit (INDEPENDENT_AMBULATORY_CARE_PROVIDER_SITE_OTHER): Payer: BLUE CROSS/BLUE SHIELD | Admitting: *Deleted

## 2014-12-09 ENCOUNTER — Ambulatory Visit (HOSPITAL_COMMUNITY): Payer: BLUE CROSS/BLUE SHIELD | Admitting: Specialist

## 2014-12-09 DIAGNOSIS — M629 Disorder of muscle, unspecified: Secondary | ICD-10-CM

## 2014-12-09 DIAGNOSIS — M25512 Pain in left shoulder: Secondary | ICD-10-CM

## 2014-12-09 DIAGNOSIS — Z5181 Encounter for therapeutic drug level monitoring: Secondary | ICD-10-CM

## 2014-12-09 DIAGNOSIS — M25612 Stiffness of left shoulder, not elsewhere classified: Secondary | ICD-10-CM

## 2014-12-09 DIAGNOSIS — I4891 Unspecified atrial fibrillation: Secondary | ICD-10-CM

## 2014-12-09 DIAGNOSIS — M6289 Other specified disorders of muscle: Secondary | ICD-10-CM

## 2014-12-09 LAB — POCT INR: INR: 3.9

## 2014-12-09 NOTE — Therapy (Signed)
[facility] [place], [address] Phone: [phone]   Fax:  [phone]  Occupational Therapy Evaluation  Patient Details  Name: Tony [name] MRN: [id number] Date of Birth: 03/27/1951 Referring Provider:  Francena Hanly, MD  Encounter Date: 12/09/2014      OT End of Session - 12/09/14 1504    Visit Number 11   Number of Visits 24   Date for OT Re-Evaluation 01/07/15   Authorization Type BC[employer]   Authorization Time Period Pt has visit limit of 30, has used 8 as of 11/08/14   Authorization - Visit Number 19   Authorization - Number of Visits 30   OT Start Time 1305   OT Stop Time 1350   OT Time Calculation (min) 45 min   Activity Tolerance Patient tolerated treatment well   Behavior During Therapy [facility] for tasks assessed/performed      Past Medical History  Diagnosis Date  . Hypertension   . Hyperlipidemia   . Atrial fibrillation     unknown onset  . Chronic anticoagulation   . Tobacco abuse   . Degenerative joint disease     Left TKA; right shoulder surgery also advised  . Obesity   . Dysrhythmia   . COPD (chronic obstructive pulmonary disease)   . Shortness of breath dyspnea   . Sleep apnea     Past Surgical History  Procedure Laterality Date  . Total knee arthroplasty      Left  . Joint replacement    . Shoulder surgery    . 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  Visit Diagnosis:  Shoulder pain, left  Shoulder joint stiffness, left  Tight fascia      Subjective Assessment - 12/09/14 1309    Subjective  S:  My shoulder stays sore.   Currently in Pain? Yes   Pain Score 4    Pain Location Shoulder   Pain Orientation Left   Pain Descriptors / Indicators Sore   Pain Type Acute pain            [facility] OT Assessment - 12/09/14 0001    Assessment   Diagnosis Left reverse TSA   Precautions   Precautions Shoulder   Type of Shoulder Precautions Per MD: Begin ROM full range. Progress as tolerated.                   OT Treatments/Exercises (OP) - 12/09/14 0001    Exercises   Exercises Shoulder;Elbow   Shoulder Exercises: Supine   Protraction PROM;AAROM;AROM;10 reps   Horizontal ABduction PROM;AROM;AAROM;10 reps   External Rotation PROM;AROM;AAROM;10 reps   External Rotation Limitations within protocol   Internal Rotation PROM;AROM;AAROM;10 reps   Internal Rotation Limitations within protocol   Flexion PROM;AROM;AAROM;10 reps   Flexion Limitations within protocol   ABduction PROM;AROM;AAROM;10 reps   ABduction Limitations within protocol   Other Supine Exercises serratus anterior punch 10 times    Shoulder Exercises: Seated   Protraction AROM;10 reps   Horizontal ABduction AROM;10 reps   External Rotation AROM;10 reps   External Rotation Limitations within protocol limits    Internal Rotation AROM;10 reps   Internal Rotation Limitations within protocol limits   Flexion AROM;10 reps   Flexion Limitations within protocol limits   Abduction AROM;10 reps  ABduction Limitations within protocol limits   Shoulder Exercises: ROM/Strengthening   Proximal Shoulder Strengthening, Supine 10X no rest break   Manual Therapy   Manual Therapy Myofascial release   Myofascial Release Myofascial release to left upper arm, trapezius, and scapularis region to decrease fascial restrictions and increase joint mobility in a pain free zone.                OT Education - 12/09/14 1513    Education provided Yes   Education Details place towel under upper arm when completing ER in seated   Person(s) Educated Patient   Methods Explanation;Demonstration   Comprehension Verbalized understanding;Returned demonstration          OT Short Term Goals - 12/05/14 1329    OT SHORT  TERM GOAL #1   Title Pt will be educated on and independent in HEP.    Status On-going   OT SHORT TERM GOAL #2   Title Pt will decrease pain to 3/10 or less during daily tasks.    Status Achieved   OT SHORT TERM GOAL #3   Title Pt will decrease fascial restrictions from max to mod amount.    Status On-going   OT SHORT TERM GOAL #4   Title Pt will increase PROM to greatest range within protocol limits.    Status Achieved   OT SHORT TERM GOAL #5   Title Pt will increase strength to 3/5 to increase ability to donn shirts.    Status Achieved           OT Long Term Goals - 12/05/14 1333    OT LONG TERM GOAL #1   Title Pt will return to previous level of function and independence in daily activities.    Status On-going   OT LONG TERM GOAL #2   Title Pt will decrease pain to 1/10 or less during daily tasks.    Status On-going   OT LONG TERM GOAL #3   Title Pt will decrease fascial restrictions from mod to min amounts or less.    Status On-going   OT LONG TERM GOAL #4   Title Pt will increase AROM to [facility] to increase ability to reach into overhead cabinets.    Status On-going   OT LONG TERM GOAL #5   Title Pt will increase strength to 4/5 to increase ability to lift items while completing household tasks.   Status On-going               Plan - 12/09/14 1504    Clinical Impression Statement A:  Patient completed AROM in supine and seated this date.  Most difficulty with external rotation.  Educated patient on technique to complete ER holding towel under upper arm at home for improved technique.    Plan P:  Continue to improve AROM ER to limits approved by MD. work on [employer].        Problem List Patient Active Problem List   Diagnosis Date Noted  . 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 Encounter for therapeutic drug monitoring 05/11/2013  . Hypertension   . Hyperlipidemia   . Chronic anticoagulation   . Degenerative joint disease   . Obesity   . TOBACCO  ABUSE 03/18/2009  . Atrial fibrillation 03/18/2009    Shirlean Mylar, OTR/L [phone]  12/09/2014, 3:14 PM  [facility] [address] [place], [place], [address] Phone: [phone]   Fax:  [phone]

## 2014-12-11 ENCOUNTER — Ambulatory Visit (HOSPITAL_COMMUNITY): Payer: BLUE CROSS/BLUE SHIELD

## 2014-12-11 ENCOUNTER — Encounter (HOSPITAL_COMMUNITY): Payer: Self-pay

## 2014-12-11 DIAGNOSIS — M25512 Pain in left shoulder: Secondary | ICD-10-CM | POA: Diagnosis not present

## 2014-12-11 DIAGNOSIS — M6289 Other specified disorders of muscle: Secondary | ICD-10-CM

## 2014-12-11 DIAGNOSIS — M25612 Stiffness of left shoulder, not elsewhere classified: Secondary | ICD-10-CM

## 2014-12-11 DIAGNOSIS — M629 Disorder of muscle, unspecified: Secondary | ICD-10-CM

## 2014-12-11 DIAGNOSIS — R29898 Other symptoms and signs involving the musculoskeletal system: Secondary | ICD-10-CM

## 2014-12-11 NOTE — Therapy (Signed)
[facility] [address] [place], [place], [address] Phone: [phone]   Fax:  [phone]  Occupational Therapy Treatment  Patient Details  Name: Tony [name] MRN: [id number] Date of Birth: [birth year] Referring Provider:  Assunta Found, MD  Encounter Date: 12/11/2014      OT End of Session - 12/11/14 1339    Visit Number 12   Number of Visits 24   Date for OT Re-Evaluation 01/07/15   Authorization Type BCBS   Authorization Time Period Pt has visit limit of 30, has used 8 as of 11/08/14   Authorization - Visit Number 20   Authorization - Number of Visits 30   OT Start Time 1302   OT Stop Time 1345   OT Time Calculation (min) 43 min   Activity Tolerance Patient tolerated treatment well   Behavior During Therapy [facility] for tasks assessed/performed      Past Medical History  Diagnosis Date  . Hypertension   . Hyperlipidemia   . Atrial fibrillation     unknown onset  . Chronic anticoagulation   . Tobacco abuse   . Degenerative joint disease     Left TKA; right shoulder surgery also advised  . Obesity   . Dysrhythmia   . COPD (chronic obstructive pulmonary disease)   . Shortness of breath dyspnea   . Sleep apnea     Past Surgical History  Procedure Laterality Date  . Total knee arthroplasty      Left  . Joint replacement    . Shoulder surgery    . 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  Visit Diagnosis:  Shoulder joint stiffness, left  Shoulder pain, left  Tight fascia  Left arm weakness          [facility] OT Assessment - 12/11/14 1321    Assessment   Diagnosis Left reverse TSA   Precautions   Precautions Shoulder   Type of Shoulder Precautions Per MD: Begin ROM full range. Progress as tolerated.                    OT Treatments/Exercises (OP) - 12/11/14 1321    Exercises   Exercises Shoulder;Elbow   Shoulder Exercises: Supine   Protraction PROM;10 reps;AROM;15 reps   Horizontal ABduction PROM;AROM;10 reps   External Rotation PROM;AROM;10 reps   Internal Rotation PROM;AROM;10 reps   Flexion PROM;AROM;10 reps;Limitations   Flexion Limitations Painful    ABduction PROM;10 reps;AROM;5 reps   ABduction Limitations Painful   Shoulder Exercises: Standing   Protraction AROM;10 reps   Horizontal ABduction AROM;10 reps   External Rotation AROM;10 reps   Internal Rotation AROM;10 reps   Flexion AROM;10 reps   ABduction AROM;10 reps   Shoulder Exercises: ROM/Strengthening   X to V Arms 5X   Proximal Shoulder Strengthening, Supine 12X no rest breaks   Proximal Shoulder Strengthening, Seated 10X no rest breaks   Manual Therapy   Manual Therapy Myofascial release   Myofascial Release Myofascial release to left upper arm, trapezius, and scapularis region to decrease fascial restrictions and increase joint mobility in a pain free zone.                   OT Short Term Goals - 12/11/14 1347    OT SHORT TERM GOAL #  1   Title Pt will be educated on and independent in HEP.    Status On-going   OT SHORT TERM GOAL #2   Title Pt will decrease pain to 3/10 or less during daily tasks.    OT SHORT TERM GOAL #3   Title Pt will decrease fascial restrictions from max to mod amount.    Status On-going   OT SHORT TERM GOAL #4   Title Pt will increase PROM to greatest range within protocol limits.    OT SHORT TERM GOAL #5   Title Pt will increase strength to 3/5 to increase ability to donn shirts.            OT Long Term Goals - 12/05/14 1333    OT LONG TERM GOAL #1   Title Pt will return to previous level of function and independence in daily activities.    Status On-going   OT LONG TERM GOAL #2   Title Pt will decrease pain to 1/10 or less during daily tasks.     Status On-going   OT LONG TERM GOAL #3   Title Pt will decrease fascial restrictions from mod to min amounts or less.    Status On-going   OT LONG TERM GOAL #4   Title Pt will increase AROM to [facility] to increase ability to reach into overhead cabinets.    Status On-going   OT LONG TERM GOAL #5   Title Pt will increase strength to 4/5 to increase ability to lift items while completing household tasks.   Status On-going               Plan - 12/11/14 1340    Clinical Impression Statement A: Added overhead lacing, X to V arms, and theraband scapular exercises. Pt continues to have limitations with ROM primarily during standing exercises. Facial grimancing noted during exercises. Pt needs reminders for proper form and to slow down movement.   Plan P: Cont to work on increase AROM during supine. Work on independence with decreasing speed and completing proper form during exercises.        Problem List Patient Active Problem List   Diagnosis Date Noted  . 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 Encounter for therapeutic drug monitoring 05/11/2013  . Hypertension   . Hyperlipidemia   . Chronic anticoagulation   . Degenerative joint disease   . Obesity   . TOBACCO ABUSE 03/18/2009  . Atrial fibrillation 03/18/2009    Limmie Patricia, OTR/L,CBIS  [phone]  12/11/2014, 1:47 PM  [place] [facility] [address] [place], [place], [address] Phone: [phone]   Fax:  [phone]

## 2014-12-13 ENCOUNTER — Encounter (HOSPITAL_COMMUNITY): Payer: Self-pay

## 2014-12-13 ENCOUNTER — Ambulatory Visit (HOSPITAL_COMMUNITY): Payer: BLUE CROSS/BLUE SHIELD

## 2014-12-13 DIAGNOSIS — M25612 Stiffness of left shoulder, not elsewhere classified: Secondary | ICD-10-CM

## 2014-12-13 DIAGNOSIS — M25512 Pain in left shoulder: Secondary | ICD-10-CM | POA: Diagnosis not present

## 2014-12-13 DIAGNOSIS — M6289 Other specified disorders of muscle: Secondary | ICD-10-CM

## 2014-12-13 DIAGNOSIS — M629 Disorder of muscle, unspecified: Secondary | ICD-10-CM

## 2014-12-13 DIAGNOSIS — R29898 Other symptoms and signs involving the musculoskeletal system: Secondary | ICD-10-CM

## 2014-12-13 NOTE — Therapy (Signed)
[facility] [address] [place], [place], [address] Phone: [phone]   Fax:  [phone]  Occupational Therapy Treatment  Patient Details  Name: Tony [name] MRN: [id number] Date of Birth: February 07, 1951 Referring Provider:  Assunta Found, MD  Encounter Date: 12/13/2014      OT End of Session - 12/13/14 1502    Visit Number 13   Number of Visits 24   Date for OT Re-Evaluation 01/07/15   Authorization Type BCBS   Authorization Time Period Pt has visit limit of 30, has used 8 as of 11/08/14   Authorization - Visit Number 21   Authorization - Number of Visits 30   OT Start Time 1305   OT Stop Time 1345   OT Time Calculation (min) 40 min   Activity Tolerance Patient tolerated treatment well   Behavior During Therapy [facility] for tasks assessed/performed      Past Medical History  Diagnosis Date  . Hypertension   . Hyperlipidemia   . Atrial fibrillation     unknown onset  . Chronic anticoagulation   . Tobacco abuse   . Degenerative joint disease     Left TKA; right shoulder surgery also advised  . Obesity   . Dysrhythmia   . COPD (chronic obstructive pulmonary disease)   . Shortness of breath dyspnea   . Sleep apnea     Past Surgical History  Procedure Laterality Date  . Total knee arthroplasty      Left  . Joint replacement    . Shoulder surgery    . 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  Visit Diagnosis:  Shoulder joint stiffness, left  Shoulder pain, left  Tight fascia  Left arm weakness      Subjective Assessment - 12/13/14 1327    Subjective  S: The exercise where I hold a towel under my armpit is still hard to do.   Currently in Pain? Yes   Pain Score 1    Pain Location Shoulder   Pain Orientation Left   Pain  Descriptors / Indicators Sore   Pain Type Acute pain                      OT Treatments/Exercises (OP) - 12/13/14 1328    Exercises   Exercises Shoulder;Elbow   Shoulder Exercises: Supine   Protraction PROM;10 reps;AROM;12 reps   Horizontal ABduction PROM;10 reps;AROM;12 reps   External Rotation PROM;10 reps;AROM;12 reps   Internal Rotation PROM;10 reps;AROM;12 reps   Flexion PROM;10 reps;AROM;12 reps   Flexion Limitations Slight pain with descent   ABduction PROM;10 reps;AROM;12 reps   Shoulder Exercises: Seated   Protraction AROM;10 reps   Horizontal ABduction AROM;10 reps   External Rotation AROM;10 reps   Internal Rotation AROM;10 reps   Flexion AROM;10 reps   Abduction AROM;10 reps   Shoulder Exercises: ROM/Strengthening   UBE (Upper Arm Bike) Level 1 2' forward 2' reverse   X to V Arms 10X   Proximal Shoulder Strengthening, Supine 12X no rest breaks   Proximal Shoulder Strengthening, Seated 10X no rest breaks   Manual Therapy   Manual Therapy Myofascial release   Myofascial Release Myofascial release to left upper arm, trapezius, and scapularis region to decrease fascial restrictions and increase joint mobility in a  pain free zone.                   OT Short Term Goals - 12/11/14 1347    OT SHORT TERM GOAL #1   Title Pt will be educated on and independent in HEP.    Status On-going   OT SHORT TERM GOAL #2   Title Pt will decrease pain to 3/10 or less during daily tasks.    OT SHORT TERM GOAL #3   Title Pt will decrease fascial restrictions from max to mod amount.    Status On-going   OT SHORT TERM GOAL #4   Title Pt will increase PROM to greatest range within protocol limits.    OT SHORT TERM GOAL #5   Title Pt will increase strength to 3/5 to increase ability to donn shirts.            OT Long Term Goals - 12/05/14 1333    OT LONG TERM GOAL #1   Title Pt will return to previous level of function and independence in daily  activities.    Status On-going   OT LONG TERM GOAL #2   Title Pt will decrease pain to 1/10 or less during daily tasks.    Status On-going   OT LONG TERM GOAL #3   Title Pt will decrease fascial restrictions from mod to min amounts or less.    Status On-going   OT LONG TERM GOAL #4   Title Pt will increase AROM to [facility] to increase ability to reach into overhead cabinets.    Status On-going   OT LONG TERM GOAL #5   Title Pt will increase strength to 4/5 to increase ability to lift items while completing household tasks.   Status On-going               Plan - 12/13/14 1503    Clinical Impression Statement A: Added UBE bike this session. Patient had no reports of pain during task. Pt did a great job with form and exercise speed as he required no vc's during exercises.    Plan P: Increase seated and supine  AROM repetitions if able to tolerate.         Problem List Patient Active Problem List   Diagnosis Date Noted  . 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 Encounter for therapeutic drug monitoring 05/11/2013  . Hypertension   . Hyperlipidemia   . Chronic anticoagulation   . Degenerative joint disease   . Obesity   . TOBACCO ABUSE 03/18/2009  . Atrial fibrillation 03/18/2009    Limmie Patricia, OTR/L,CBIS  [phone]  12/13/2014, 3:05 PM  [place] [facility] [address] [place], [place], [address] Phone: [phone]   Fax:  [phone]

## 2014-12-16 ENCOUNTER — Ambulatory Visit (HOSPITAL_COMMUNITY): Payer: BLUE CROSS/BLUE SHIELD | Admitting: Specialist

## 2014-12-16 DIAGNOSIS — M25612 Stiffness of left shoulder, not elsewhere classified: Secondary | ICD-10-CM

## 2014-12-16 DIAGNOSIS — R29898 Other symptoms and signs involving the musculoskeletal system: Secondary | ICD-10-CM

## 2014-12-16 DIAGNOSIS — M25512 Pain in left shoulder: Secondary | ICD-10-CM | POA: Diagnosis not present

## 2014-12-16 NOTE — Therapy (Signed)
[facility] [address] [place], [place], [address] Phone: [phone]   Fax:  [phone]  Occupational Therapy Treatment  Patient Details  Name: Tony [name] MRN: [id number] Date of Birth: [birth year] Referring Provider:  Francena Hanly, MD  Encounter Date: 12/16/2014      OT End of Session - 12/16/14 1342    Visit Number 14   Number of Visits 24   Date for OT Re-Evaluation 01/07/15   Authorization Type BCBS   Authorization Time Period Pt has visit limit of 30, has used 8 as of 11/08/14   Authorization - Visit Number 22   Authorization - Number of Visits 30   OT Start Time 1306   OT Stop Time 1347   OT Time Calculation (min) 41 min   Activity Tolerance Patient tolerated treatment well   Behavior During Therapy [facility] for tasks assessed/performed      Past Medical History  Diagnosis Date  . Hypertension   . Hyperlipidemia   . Atrial fibrillation     unknown onset  . Chronic anticoagulation   . Tobacco abuse   . Degenerative joint disease     Left TKA; right shoulder surgery also advised  . Obesity   . Dysrhythmia   . COPD (chronic obstructive pulmonary disease)   . Shortness of breath dyspnea   . Sleep apnea     Past Surgical History  Procedure Laterality Date  . Total knee arthroplasty      Left  . Joint replacement    . Shoulder surgery    . 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  Visit Diagnosis:  Shoulder joint stiffness, left  Shoulder pain, left  Left arm weakness      Subjective Assessment - 12/16/14 1306    Subjective  S:   I think I did too many exercises this weekend, its a little sore.   Currently in Pain? Yes   Pain Score 3    Pain Location Shoulder   Pain Orientation Left   Pain Descriptors /  Indicators Sore   Pain Type Acute pain            [facility] OT Assessment - 12/16/14 0001    Assessment   Diagnosis Left reverse TSA   Precautions   Precautions Shoulder   Type of Shoulder Precautions Per MD: Begin ROM full range. Progress as tolerated.                   OT Treatments/Exercises (OP) - 12/16/14 0001    Shoulder Exercises: Supine   Protraction PROM;5 reps;AROM;15 reps   Horizontal ABduction PROM;5 reps;AROM;15 reps   External Rotation PROM;5 reps;AROM;15 reps   Internal Rotation PROM;5 reps;AROM;15 reps   Flexion PROM;5 reps;AROM;15 reps   ABduction PROM;5 reps;AROM;15 reps   Shoulder Exercises: Seated   Extension AROM;10 reps   Row AROM;10 reps   Protraction AROM;10 reps   Horizontal ABduction AROM;10 reps   External Rotation AROM;10 reps   Internal Rotation AROM;10 reps   Flexion AROM;10 reps   Abduction AROM;10 reps   Other Seated Exercises retraction AROM 10 times   Shoulder Exercises: ROM/Strengthening   UBE (Upper Arm Bike) levle 1 3 minutes forward and reverse   Wall Wash attempted 2 minutes, ended at 1 1/2 minutes due  to increased pain and fatigue   X to V Arms 10 times max vg for technique   Proximal Shoulder Strengthening, Supine 12X no rest breaks   Proximal Shoulder Strengthening, Seated 10X no rest breaks   Manual Therapy   Manual Therapy Myofascial release   Myofascial Release Myofascial release to left upper arm, trapezius, and scapularis region to decrease fascial restrictions and increase joint mobility in a pain free zone.                   OT Short Term Goals - 12/11/14 1347    OT SHORT TERM GOAL #1   Title Pt will be educated on and independent in HEP.    Status On-going   OT SHORT TERM GOAL #2   Title Pt will decrease pain to 3/10 or less during daily tasks.    OT SHORT TERM GOAL #3   Title Pt will decrease fascial restrictions from max to mod amount.    Status On-going   OT SHORT TERM GOAL #4   Title Pt will  increase PROM to greatest range within protocol limits.    OT SHORT TERM GOAL #5   Title Pt will increase strength to 3/5 to increase ability to donn shirts.            OT Long Term Goals - 12/05/14 1333    OT LONG TERM GOAL #1   Title Pt will return to previous level of function and independence in daily activities.    Status On-going   OT LONG TERM GOAL #2   Title Pt will decrease pain to 1/10 or less during daily tasks.    Status On-going   OT LONG TERM GOAL #3   Title Pt will decrease fascial restrictions from mod to min amounts or less.    Status On-going   OT LONG TERM GOAL #4   Title Pt will increase AROM to [facility] to increase ability to reach into overhead cabinets.    Status On-going   OT LONG TERM GOAL #5   Title Pt will increase strength to 4/5 to increase ability to lift items while completing household tasks.   Status On-going               Plan - 12/16/14 1344    Clinical Impression Statement A:  Patient has most difficulty with external rotation which given the type of operation patient had, is not suprising.  Good form with all exercises.  Attempted 2 min wall wash and could only complete 1.5 min   OT Frequency 2x / week   OT Duration 4 weeks   Plan P:  Due to insurance limitations, decreasing frequency to 2 times per week, complete 2 minutes of wall wash.   Consulted and Agree with Plan of Care Patient        Problem List Patient Active Problem List   Diagnosis Date Noted  . 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 Encounter for therapeutic drug monitoring 05/11/2013  . Hypertension   . Hyperlipidemia   . Chronic anticoagulation   . Degenerative joint disease   . Obesity   . TOBACCO ABUSE 03/18/2009  . Atrial fibrillation 03/18/2009    Shirlean Mylar, OTR/L [phone]  12/16/2014, 1:47 PM  [facility] [address] [place], [place], [address] Phone: [phone]   Fax:  [phone]

## 2014-12-18 ENCOUNTER — Encounter (HOSPITAL_COMMUNITY): Payer: BLUE CROSS/BLUE SHIELD

## 2014-12-20 ENCOUNTER — Ambulatory Visit (HOSPITAL_COMMUNITY): Payer: BLUE CROSS/BLUE SHIELD | Admitting: Occupational Therapy

## 2014-12-24 ENCOUNTER — Encounter (HOSPITAL_COMMUNITY): Payer: Self-pay

## 2014-12-24 ENCOUNTER — Ambulatory Visit (HOSPITAL_COMMUNITY): Payer: BLUE CROSS/BLUE SHIELD | Attending: Orthopedic Surgery

## 2014-12-24 DIAGNOSIS — M25512 Pain in left shoulder: Secondary | ICD-10-CM | POA: Diagnosis present

## 2014-12-24 DIAGNOSIS — M6289 Other specified disorders of muscle: Secondary | ICD-10-CM

## 2014-12-24 DIAGNOSIS — M629 Disorder of muscle, unspecified: Secondary | ICD-10-CM | POA: Diagnosis present

## 2014-12-24 DIAGNOSIS — R29898 Other symptoms and signs involving the musculoskeletal system: Secondary | ICD-10-CM

## 2014-12-24 DIAGNOSIS — M25612 Stiffness of left shoulder, not elsewhere classified: Secondary | ICD-10-CM

## 2014-12-24 NOTE — Therapy (Signed)
[facility] [address] [place], [place], [address] Phone: [phone]   Fax:  [phone]  Occupational Therapy Treatment  Patient Details  Name: Tony [name] MRN: [id number] Date of Birth: 19-May-1950 Referring Provider:  Francena Hanly, MD  Encounter Date: 12/24/2014      OT End of Session - 12/24/14 1342    Visit Number 15   Number of Visits 24   Date for OT Re-Evaluation 01/07/15   Authorization Type BCBS   Authorization Time Period Pt has visit limit of 30, has used 8 as of 11/08/14   Authorization - Visit Number 23   Authorization - Number of Visits 30   OT Start Time 1305   OT Stop Time 1345   OT Time Calculation (min) 40 min   Activity Tolerance Patient tolerated treatment well   Behavior During Therapy [facility] for tasks assessed/performed      Past Medical History  Diagnosis Date  . Hypertension   . Hyperlipidemia   . Atrial fibrillation     unknown onset  . Chronic anticoagulation   . Tobacco abuse   . Degenerative joint disease     Left TKA; right shoulder surgery also advised  . Obesity   . Dysrhythmia   . COPD (chronic obstructive pulmonary disease)   . Shortness of breath dyspnea   . Sleep apnea     Past Surgical History  Procedure Laterality Date  . Total knee arthroplasty      Left  . Joint replacement    . Shoulder surgery    . 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  Visit Diagnosis:  Shoulder joint stiffness, left  Shoulder pain, left  Left arm weakness  Tight fascia      Subjective Assessment - 12/24/14 1323    Subjective  S: I still can't handle my arm being next to me. It hurts too much.   Currently in Pain? Yes   Pain Score 2    Pain Location Shoulder   Pain Orientation Left   Pain Descriptors  / Indicators Sore   Pain Type Acute pain            [facility] OT Assessment - 12/24/14 1324    Assessment   Diagnosis Left reverse TSA   Precautions   Precautions Shoulder   Type of Shoulder Precautions Per MD: Begin ROM full range. Progress as tolerated.                   OT Treatments/Exercises (OP) - 12/24/14 1324    Exercises   Exercises Shoulder;Elbow   Shoulder Exercises: Supine   Protraction PROM;5 reps;AROM;15 reps   Horizontal ABduction PROM;5 reps;AROM;15 reps   External Rotation PROM;5 reps;AROM;15 reps   Internal Rotation PROM;5 reps;AROM;15 reps   Flexion PROM;5 reps;AROM;15 reps   ABduction PROM;5 reps;AROM;15 reps   Shoulder Exercises: Standing   Protraction AROM;12 reps   Horizontal ABduction AROM;12 reps   External Rotation AROM;12 reps   Internal Rotation AROM;12 reps   Flexion AROM;12 reps   ABduction AROM;12 reps   Shoulder Exercises: ROM/Strengthening   UBE (Upper Arm Bike) Level 1 3' forward 3' reverse   Wall Wash 2'   X to V Arms 10X with left arm only.   Proximal Shoulder Strengthening, Supine 12X no  rest breaks   Proximal Shoulder Strengthening, Seated 10X no rest breaks   Manual Therapy   Manual Therapy Myofascial release   Myofascial Release Myofascial release to left upper arm, trapezius, and scapularis region to decrease fascial restrictions and increase joint mobility in a pain free zone.                   OT Short Term Goals - 12/11/14 1347    OT SHORT TERM GOAL #1   Title Pt will be educated on and independent in HEP.    Status On-going   OT SHORT TERM GOAL #2   Title Pt will decrease pain to 3/10 or less during daily tasks.    OT SHORT TERM GOAL #3   Title Pt will decrease fascial restrictions from max to mod amount.    Status On-going   OT SHORT TERM GOAL #4   Title Pt will increase PROM to greatest range within protocol limits.    OT SHORT TERM GOAL #5   Title Pt will increase strength to 3/5 to increase  ability to donn shirts.            OT Long Term Goals - 12/05/14 1333    OT LONG TERM GOAL #1   Title Pt will return to previous level of function and independence in daily activities.    Status On-going   OT LONG TERM GOAL #2   Title Pt will decrease pain to 1/10 or less during daily tasks.    Status On-going   OT LONG TERM GOAL #3   Title Pt will decrease fascial restrictions from mod to min amounts or less.    Status On-going   OT LONG TERM GOAL #4   Title Pt will increase AROM to [facility] to increase ability to reach into overhead cabinets.    Status On-going   OT LONG TERM GOAL #5   Title Pt will increase strength to 4/5 to increase ability to lift items while completing household tasks.   Status On-going               Plan - 12/24/14 1503    Clinical Impression Statement A: Informed patient that he has 8 more visits that insurance will cover for this year. Pt was able to complete 2' of wall washing this session with reports of muscle fatigue.   Plan P: Take measurements for MD appt on Friday. Add overhead lacing.         Problem List Patient Active Problem List   Diagnosis Date Noted  . 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 Encounter for therapeutic drug monitoring 05/11/2013  . Hypertension   . Hyperlipidemia   . Chronic anticoagulation   . Degenerative joint disease   . Obesity   . TOBACCO ABUSE 03/18/2009  . Atrial fibrillation 03/18/2009    Limmie Patricia, OTR/L,CBIS  [phone]  12/24/2014, 3:05 PM  [facility] [address] [place], [place], [address] Phone: [phone]   Fax:  [phone]

## 2014-12-25 ENCOUNTER — Ambulatory Visit (INDEPENDENT_AMBULATORY_CARE_PROVIDER_SITE_OTHER): Payer: BLUE CROSS/BLUE SHIELD | Admitting: Neurology

## 2014-12-25 DIAGNOSIS — G4734 Idiopathic sleep related nonobstructive alveolar hypoventilation: Secondary | ICD-10-CM

## 2014-12-25 DIAGNOSIS — G4733 Obstructive sleep apnea (adult) (pediatric): Secondary | ICD-10-CM

## 2014-12-25 DIAGNOSIS — I4819 Other persistent atrial fibrillation: Secondary | ICD-10-CM

## 2014-12-26 ENCOUNTER — Ambulatory Visit (HOSPITAL_COMMUNITY): Payer: BLUE CROSS/BLUE SHIELD

## 2014-12-26 NOTE — Sleep Study (Signed)
Please see the scanned sleep study interpretation located in the Procedure tab within the Chart Review section. 

## 2014-12-27 ENCOUNTER — Telehealth: Payer: Self-pay | Admitting: Neurology

## 2014-12-27 DIAGNOSIS — G4734 Idiopathic sleep related nonobstructive alveolar hypoventilation: Secondary | ICD-10-CM

## 2014-12-27 DIAGNOSIS — G4733 Obstructive sleep apnea (adult) (pediatric): Secondary | ICD-10-CM

## 2014-12-27 DIAGNOSIS — I482 Chronic atrial fibrillation, unspecified: Secondary | ICD-10-CM

## 2014-12-27 NOTE — Telephone Encounter (Signed)
Patient referred by Dr. Phillips Odor, seen by me on 10/15/14, split night study on 12/25/14, ins: [employer].   Lafonda Mosses:   Please call and notify patient that the recent sleep study confirmed the diagnosis of severe OSA. He did well with CPAP during the study with significant improvement of the respiratory events. Therefore, I would like start the patient on CPAP therapy at home by prescribing a machine for home use. I placed the order in the chart. The patient will need a follow up appointment with me in 8 to 10 weeks post set up that has to be scheduled; please go ahead and schedule while you have the patient on the phone and make sure patient understands the importance of keeping this window for the FU appointment, as it is often an insurance requirement and failing to adhere to this may result in losing coverage for sleep apnea treatment.   Please re-enforce the importance of compliance with treatment and the need for [place] to monitor compliance data - again an insurance requirement and good feedback for the patient as far as how they are doing.  Also remind patient, that any upcoming CPAP machine or mask issues, should be first addressed with the DME company. Please ask if patient has a preference regarding DME company.  He will need an ONO set up 1 week after CPAP, pls fax order to DME for this as well.   Please arrange for CPAP set up at home through a DME company of patient's choice - once you have spoken to the patient - and faxed/routed report to PCP and referring MD (if other than PCP), you can close this encounter, thanks,   Huston Foley, MD, PhD [facility] ([facility])

## 2014-12-30 ENCOUNTER — Ambulatory Visit (INDEPENDENT_AMBULATORY_CARE_PROVIDER_SITE_OTHER): Payer: BLUE CROSS/BLUE SHIELD | Admitting: *Deleted

## 2014-12-30 ENCOUNTER — Ambulatory Visit (HOSPITAL_COMMUNITY): Payer: BLUE CROSS/BLUE SHIELD | Admitting: Specialist

## 2014-12-30 DIAGNOSIS — I4891 Unspecified atrial fibrillation: Secondary | ICD-10-CM

## 2014-12-30 DIAGNOSIS — M25612 Stiffness of left shoulder, not elsewhere classified: Secondary | ICD-10-CM | POA: Diagnosis not present

## 2014-12-30 DIAGNOSIS — Z5181 Encounter for therapeutic drug level monitoring: Secondary | ICD-10-CM

## 2014-12-30 DIAGNOSIS — M25512 Pain in left shoulder: Secondary | ICD-10-CM

## 2014-12-30 LAB — POCT INR: INR: 2.9

## 2014-12-30 NOTE — Telephone Encounter (Signed)
Called cell phone 2x, got disconnected both times before we were able to talk.  Called home phone, no answer.  Called cell phone back and left message to call back.

## 2014-12-30 NOTE — Therapy (Signed)
[facility] [address] [place], [place], [address] Phone: [phone]   Fax:  [phone]  Occupational Therapy Treatment  Patient Details  Name: Tony [name] MRN: [id number] Date of Birth: Sep 22, 1950 Referring Provider:  Assunta Found, MD  Encounter Date: 12/30/2014      OT End of Session - 12/30/14 1343    Visit Number 16   Number of Visits 24   Date for OT Re-Evaluation 01/07/15   Authorization Type BCBS   Authorization Time Period Pt has visit limit of 30, has used 8 as of 11/08/14   Authorization - Visit Number 24   Authorization - Number of Visits 30   OT Start Time 1305   OT Stop Time 1345   OT Time Calculation (min) 40 min   Activity Tolerance Patient tolerated treatment well   Behavior During Therapy [facility] for tasks assessed/performed      Past Medical History  Diagnosis Date  . Hypertension   . Hyperlipidemia   . Atrial fibrillation     unknown onset  . Chronic anticoagulation   . Tobacco abuse   . Degenerative joint disease     Left TKA; right shoulder surgery also advised  . Obesity   . Dysrhythmia   . COPD (chronic obstructive pulmonary disease)   . Shortness of breath dyspnea   . Sleep apnea     Past Surgical History  Procedure Laterality Date  . Total knee arthroplasty      Left  . Joint replacement    . Shoulder surgery    . 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  Visit Diagnosis:  Shoulder joint stiffness, left  Shoulder pain, left      Subjective Assessment - 12/30/14 1306    Subjective  S:  Its sore when I do different things like pulling my pants up on the left side.    Pain Score 2    Pain Location Shoulder   Pain Orientation Left   Pain Descriptors / Indicators Sore   Pain Type Acute pain             [facility] OT Assessment - 12/30/14 0001    Assessment   Diagnosis Left reverse TSA   Precautions   Precautions Shoulder   Type of Shoulder Precautions Per MD: Begin ROM full range. Progress as tolerated.                   OT Treatments/Exercises (OP) - 12/30/14 0001    Exercises   Exercises Shoulder;Elbow   Shoulder Exercises: Supine   Protraction PROM;5 reps;AROM;15 reps   Horizontal ABduction PROM;5 reps;AROM;15 reps   External Rotation PROM;5 reps;AROM;15 reps   Internal Rotation PROM;5 reps;AROM;15 reps   Flexion PROM;5 reps;AROM;15 reps   ABduction PROM;5 reps;AROM;15 reps   Other Supine Exercises serratus anterior punch 15 times    Shoulder Exercises: Seated   Extension AROM;15 reps   Row AROM;15 reps   Protraction AROM;15 reps   Horizontal ABduction AROM;15 reps   External Rotation AROM;15 reps   Internal Rotation AROM;15 reps   Flexion AROM;15 reps   Abduction AROM;15 reps   Other Seated Exercises overhead press 5 times max difficulty   Shoulder Exercises: ROM/Strengthening   UBE (Upper Arm Bike) level 1 3 min only  Over Head Lace 2'   X to V Arms 10 times   Proximal Shoulder Strengthening, Supine 12X no rest breaks   Manual Therapy   Manual Therapy Myofascial release   Myofascial Release Myofascial release to left upper arm, trapezius, and scapularis region to decrease fascial restrictions and increase joint mobility in a pain free zone.                   OT Short Term Goals - 12/11/14 1347    OT SHORT TERM GOAL #1   Title Pt will be educated on and independent in HEP.    Status On-going   OT SHORT TERM GOAL #2   Title Pt will decrease pain to 3/10 or less during daily tasks.    OT SHORT TERM GOAL #3   Title Pt will decrease fascial restrictions from max to mod amount.    Status On-going   OT SHORT TERM GOAL #4   Title Pt will increase PROM to greatest range within protocol limits.    OT SHORT TERM GOAL #5   Title Pt will  increase strength to 3/5 to increase ability to donn shirts.            OT Long Term Goals - 12/05/14 1333    OT LONG TERM GOAL #1   Title Pt will return to previous level of function and independence in daily activities.    Status On-going   OT LONG TERM GOAL #2   Title Pt will decrease pain to 1/10 or less during daily tasks.    Status On-going   OT LONG TERM GOAL #3   Title Pt will decrease fascial restrictions from mod to min amounts or less.    Status On-going   OT LONG TERM GOAL #4   Title Pt will increase AROM to [facility] to increase ability to reach into overhead cabinets.    Status On-going   OT LONG TERM GOAL #5   Title Pt will increase strength to 4/5 to increase ability to lift items while completing household tasks.   Status On-going               Plan - 12/30/14 1343    Clinical Impression Statement A:  Patient completed overhead lace this date, very fatiguing for patient.  Overhead press is very painful and difficult for patient.    Plan P:  complete w arms for scapular stability and modified prone exercises.         Problem List Patient Active Problem List   Diagnosis Date Noted  . 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 Encounter for therapeutic drug monitoring 05/11/2013  . Hypertension   . Hyperlipidemia   . Chronic anticoagulation   . Degenerative joint disease   . Obesity   . TOBACCO ABUSE 03/18/2009  . Atrial fibrillation 03/18/2009    Shirlean Mylar, OTR/L [phone]  12/30/2014, 1:46 PM  [facility] [address] [place], [place], [address] Phone: [phone]   Fax:  [phone]

## 2014-12-30 NOTE — Telephone Encounter (Signed)
Wife called back (on Hippa). I gave her the results and recommendations. I will send order to [facility], they already have an account with them. I will fax report to PCP. I will also send patient letter reminding him about appt and importance of compliance.

## 2015-01-01 ENCOUNTER — Encounter (HOSPITAL_COMMUNITY): Payer: BLUE CROSS/BLUE SHIELD | Admitting: Specialist

## 2015-01-03 ENCOUNTER — Encounter (HOSPITAL_COMMUNITY): Payer: BLUE CROSS/BLUE SHIELD | Admitting: Occupational Therapy

## 2015-01-08 ENCOUNTER — Ambulatory Visit (HOSPITAL_COMMUNITY): Payer: BLUE CROSS/BLUE SHIELD | Admitting: Specialist

## 2015-01-08 DIAGNOSIS — M25612 Stiffness of left shoulder, not elsewhere classified: Secondary | ICD-10-CM

## 2015-01-08 DIAGNOSIS — M25512 Pain in left shoulder: Secondary | ICD-10-CM

## 2015-01-08 DIAGNOSIS — M6289 Other specified disorders of muscle: Secondary | ICD-10-CM

## 2015-01-08 DIAGNOSIS — M629 Disorder of muscle, unspecified: Secondary | ICD-10-CM

## 2015-01-08 NOTE — Therapy (Signed)
[place], [place], [address] Phone: [phone]   Fax:  [phone]  Occupational Therapy Treatment/Recertification  Patient Details  Name: Tony [name] MRN: [id number] Date of Birth: 11/03/1950 Referring Provider:  Justice Britain, MD  Encounter Date: 01/08/2015      OT End of Session - 01/08/15 1402    Visit Number 17   Number of Visits 24   Date for OT Re-Evaluation 03/09/15  02/06/15   Authorization Type BCBS   Authorization Time Period Pt has visit limit of 30, has used 8 as of 11/08/14   Authorization - Visit Number 25   Authorization - Number of Visits 30   OT Start Time 6195   OT Stop Time 1345   OT Time Calculation (min) 40 min   Activity Tolerance Patient tolerated treatment well   Behavior During Therapy [facility] for tasks assessed/performed      Past Medical History  Diagnosis Date  . Hypertension   . Hyperlipidemia   . Atrial fibrillation     unknown onset  . Chronic anticoagulation   . Tobacco abuse   . Degenerative joint disease     Left TKA; right shoulder surgery also advised  . Obesity   . Dysrhythmia   . COPD (chronic obstructive pulmonary disease)   . Shortness of breath dyspnea   . Sleep apnea     Past Surgical History  Procedure Laterality Date  . Total knee arthroplasty      Left  . Joint replacement    . Shoulder surgery    . Reverse shoulder arthroplasty Left 10/17/2014    Procedure: LEFT REVERSE SHOULDER ARTHROPLASTY;  Surgeon: Justice Britain, MD;  Location: [place];  Service: Orthopedics;  Laterality: Left;    There were no vitals filed for this visit.  Visit Diagnosis:  Shoulder joint stiffness, left - Plan: Ot plan of care cert/re-cert  Shoulder pain, left - Plan: Ot plan of care cert/re-cert  Tight fascia - Plan: Ot plan of care cert/re-cert      Subjective Assessment - 01/08/15 1308    Subjective  S:              [facility] OT Assessment - 01/08/15 0001    Assessment   Diagnosis Left reverse TSA   Precautions   Precautions Shoulder   Type of Shoulder Precautions Per MD: Begin ROM full range. Progress as tolerated.    Prior Function   Level of Independence Independent with basic ADLs   Vocation Full time employment   Vocation Requirements drive spreader truck, maintenance, lifting, reaching   Leisure softball (watching)    ADL   ADL comments Patient has not returned to work, has trouble pulling up his pants and reaching behind his back.   Observation/Other Assessments   Focus on Therapeutic Outcomes (FOTO)  65/100  was 29/100   Palpation   Palpation comment min-trace fascial restrictions   AROM   Overall AROM Comments assessed in supine (seated, last assessment 8/18 supine)   Left Shoulder Flexion 165 Degrees  180 151   Left Shoulder ABduction 176 Degrees  180 125   Left Shoulder Internal Rotation 90 Degrees  90 90   Left Shoulder External Rotation 80 Degrees  80 20   PROM   Overall PROM Comments WNL in supine, External Rotation in abducted position [facility], internal rotation is limited to 80% of right arm   Strength   Overall Strength Comments assessed in seated   Left  Shoulder Flexion 4+/5   Left Shoulder ABduction 4+/5   Left Shoulder Internal Rotation 4-/5   Left Shoulder External Rotation 4+/5                  OT Treatments/Exercises (OP) - 01/08/15 0001    Exercises   Exercises Shoulder;Elbow   Shoulder Exercises: Supine   Protraction PROM;5 reps   Horizontal ABduction PROM;5 reps   External Rotation PROM;5 reps   Internal Rotation PROM;5 reps   Flexion PROM;5 reps   ABduction PROM;5 reps   Shoulder Exercises: Seated   Extension Theraband;15 reps   Theraband Level (Shoulder Extension) Level 3 (Green)   Extension Limitations from 90 degrees flexion to 0 extension    Horizontal ABduction Theraband;15 reps   Theraband Level (Shoulder Horizontal ABduction) Level 3 (Green)   Internal Rotation Theraband;15  reps   Theraband Level (Shoulder Internal Rotation) Level 3 (Green)   Internal Rotation Limitations in 90 degrees abduction   Shoulder Exercises: Standing   Other Standing Exercises simulated pulling up pants with green mod resist tband 15 times   Shoulder Exercises: ROM/Strengthening   UBE (Upper Arm Bike) level 3 2' forward and 2' reverse   Other ROM/Strengthening Exercises using tband therapist assist arm into internal rotation behind back and held at max stretch 5" X 3 repetitions   Manual Therapy   Manual Therapy Myofascial release   Myofascial Release Myofascial release to left upper arm, trapezius, and scapularis region to decrease fascial restrictions and increase joint mobility in a pain free zone.                   OT Short Term Goals - 01/08/15 1322    OT SHORT TERM GOAL #1   Title Pt will be educated on and independent in HEP.    Status Achieved   OT SHORT TERM GOAL #2   Title Pt will decrease pain to 3/10 or less during daily tasks.    Status Achieved   OT SHORT TERM GOAL #3   Title Pt will decrease fascial restrictions from max to mod amount.    Status Achieved   OT SHORT TERM GOAL #4   Title Pt will increase PROM to greatest range within protocol limits.    Status Achieved   OT SHORT TERM GOAL #5   Title Pt will increase strength to 3/5 to increase ability to donn shirts.    Status Achieved           OT Long Term Goals - 01/08/15 1323    OT LONG TERM GOAL #1   Title Pt will return to previous level of function and independence in daily activities.    Baseline continued difficulty pulling up pants on left side and reaching behind his back.   Status Partially Met   OT LONG TERM GOAL #2   Title Pt will decrease pain to 1/10 or less during daily tasks.    Status Achieved   OT LONG TERM GOAL #3   Title Pt will decrease fascial restrictions from mod to min amounts or less.    Status Achieved   OT LONG TERM GOAL #4   Title Pt will increase AROM to [facility]  to increase ability to reach into overhead cabinets.    Baseline can't fully internally rotate with shoulder abducted   Status Partially Met   OT LONG TERM GOAL #5   Title Pt will increase strength to 4/5 to increase ability to lift items while completing household  tasks.   Baseline internal rotation is 4-/5.   Status Partially Met               Plan - 01/08/15 1406    Clinical Impression Statement A: Patient is approximately 10 weeks s/p left reverse total shoulder replacement. He has had significant improvements in AROM and PROM and strength. His primary deficit is internal rotation in an abducted position, which makes it very painful and difficult to reach behind his back and pull up his pants. Internal rotation strength is also weaker than the remaining shoulder movements.   Pt will benefit from skilled therapeutic intervention in order to improve on the following deficits (Retired) Decreased strength;Pain;Decreased activity tolerance;Impaired UE functional use;Increased fascial restricitons;Decreased range of motion;Impaired flexibility;Decreased knowledge of precautions   OT Frequency 2x / week   OT Duration 4 weeks   OT Treatment/Interventions Self-care/ADL training;Passive range of motion;Patient/family education;Cryotherapy;Electrical Stimulation;Moist Heat;Therapeutic exercise;Manual Therapy;Therapeutic activities   Plan P: Treatment to focus on strengthening of left shoulder and achieving end range internal rotation so that he can reach behind his back and pull up his pants. Manual therapy only as needed, focusing on internal rotation in abducted position.    Consulted and Agree with Plan of Care Patient        Problem List Patient Active Problem List   Diagnosis Date Noted  . 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 Encounter for therapeutic drug monitoring 05/11/2013  . Hypertension   . Hyperlipidemia   . Chronic anticoagulation   . Degenerative joint disease   .  Obesity   . TOBACCO ABUSE 03/18/2009  . Atrial fibrillation 03/18/2009    Vangie Bicker, OTR/L [phone]  01/08/2015, 2:11 PM  [place] [address] [place], [place], [address] Phone: [phone]   Fax:  [phone]

## 2015-01-10 ENCOUNTER — Encounter (HOSPITAL_COMMUNITY): Payer: BLUE CROSS/BLUE SHIELD | Admitting: Specialist

## 2015-01-13 ENCOUNTER — Other Ambulatory Visit: Payer: Self-pay | Admitting: *Deleted

## 2015-01-13 DIAGNOSIS — I4891 Unspecified atrial fibrillation: Secondary | ICD-10-CM

## 2015-01-13 MED ORDER — WARFARIN SODIUM 10 MG PO TABS: ORAL_TABLET | ORAL | Status: DC

## 2015-01-14 ENCOUNTER — Encounter (HOSPITAL_COMMUNITY): Payer: Self-pay | Admitting: Occupational Therapy

## 2015-01-14 ENCOUNTER — Ambulatory Visit (HOSPITAL_COMMUNITY): Payer: BLUE CROSS/BLUE SHIELD | Admitting: Occupational Therapy

## 2015-01-14 DIAGNOSIS — R29898 Other symptoms and signs involving the musculoskeletal system: Secondary | ICD-10-CM

## 2015-01-14 DIAGNOSIS — M25512 Pain in left shoulder: Secondary | ICD-10-CM

## 2015-01-14 DIAGNOSIS — M629 Disorder of muscle, unspecified: Secondary | ICD-10-CM

## 2015-01-14 DIAGNOSIS — M25612 Stiffness of left shoulder, not elsewhere classified: Secondary | ICD-10-CM | POA: Diagnosis not present

## 2015-01-14 DIAGNOSIS — M6289 Other specified disorders of muscle: Secondary | ICD-10-CM

## 2015-01-14 NOTE — Therapy (Signed)
[place], [place], [address] Phone: [phone]   Fax:  [phone]  Occupational Therapy Treatment  Patient Details  Name: Tony [name] MRN: [id number] Date of Birth: [birth year] Referring Provider:  Justice Britain, MD  Encounter Date: 01/14/2015      OT End of Session - 01/14/15 1454    Visit Number 18   Number of Visits 24   Date for OT Re-Evaluation 03/09/15  02/06/15   Authorization Type BCBS   Authorization Time Period Pt has visit limit of 30, has used 8 as of 11/08/14   Authorization - Visit Number 26   Authorization - Number of Visits 30   OT Start Time 1749   OT Stop Time 1431   OT Time Calculation (min) 43 min   Activity Tolerance Patient tolerated treatment well   Behavior During Therapy [facility] for tasks assessed/performed      Past Medical History  Diagnosis Date  . Hypertension   . Hyperlipidemia   . Atrial fibrillation     unknown onset  . Chronic anticoagulation   . Tobacco abuse   . Degenerative joint disease     Left TKA; right shoulder surgery also advised  . Obesity   . Dysrhythmia   . COPD (chronic obstructive pulmonary disease)   . Shortness of breath dyspnea   . Sleep apnea     Past Surgical History  Procedure Laterality Date  . Total knee arthroplasty      Left  . Joint replacement    . Shoulder surgery    . Reverse shoulder arthroplasty Left 10/17/2014    Procedure: LEFT REVERSE SHOULDER ARTHROPLASTY;  Surgeon: Justice Britain, MD;  Location: [place];  Service: Orthopedics;  Laterality: Left;    There were no vitals filed for this visit.  Visit Diagnosis:  Shoulder joint stiffness, left  Shoulder pain, left  Tight fascia  Left arm weakness      Subjective Assessment - 01/14/15 1351    Subjective  S: It's tight when I try to reach behind my back.    Currently in Pain? No/denies            [facility] OT Assessment - 01/14/15 1357    Assessment   Diagnosis Left  reverse TSA   Precautions   Precautions Shoulder   Type of Shoulder Precautions Per MD: Begin ROM full range. Progress as tolerated.                   OT Treatments/Exercises (OP) - 01/14/15 1351    Exercises   Exercises Shoulder;Elbow   Shoulder Exercises: Supine   Protraction PROM;5 reps;Strengthening;12 reps   Protraction Weight (lbs) 1   Horizontal ABduction PROM;5 reps;Strengthening;12 reps   Horizontal ABduction Weight (lbs) 1   External Rotation PROM;5 reps;Strengthening;12 reps  abducted   External Rotation Weight (lbs) 1   Internal Rotation PROM;5 reps;Strengthening;12 reps  abducted   Internal Rotation Weight (lbs) 1   Flexion PROM;5 reps;Strengthening;12 reps   Shoulder Flexion Weight (lbs) 1   ABduction PROM;5 reps;Strengthening;12 reps   Shoulder ABduction Weight (lbs) 1   Other Supine Exercises Serratus anterior punches 12X with 1# weight   Shoulder Exercises: Seated   Extension Theraband;10 reps   Theraband Level (Shoulder Extension) Level 3 (Green)   Extension Limitations from 90 degrees flexion to 0 extension    Row Theraband;10 reps   Theraband Level (Shoulder Row) Level 3 (Green)   Protraction Strengthening;10 reps  Protraction Weight (lbs) 1   Horizontal ABduction Strengthening;10 reps   Horizontal ABduction Weight (lbs) 1   External Rotation Strengthening;10 reps   External Rotation Weight (lbs) 1   Internal Rotation Strengthening;Theraband;10 reps   Theraband Level (Shoulder Internal Rotation) Level 3 (Green)   Internal Rotation Weight (lbs) 1   Flexion Strengthening;10 reps   Flexion Weight (lbs) 1   Abduction Strengthening;10 reps   ABduction Weight (lbs) 1   Shoulder Exercises: ROM/Strengthening   UBE (Upper Arm Bike) Level 3 3' forward 3' reverse   Over Head Lace 2'   X to V Arms 12X   Other ROM/Strengthening Exercises using tband therapist assist arm into internal rotation behind back and held at max stretch 5" X 3 repetitions                   OT Short Term Goals - 01/08/15 1322    OT SHORT TERM GOAL #1   Title Pt will be educated on and independent in HEP.    Status Achieved   OT SHORT TERM GOAL #2   Title Pt will decrease pain to 3/10 or less during daily tasks.    Status Achieved   OT SHORT TERM GOAL #3   Title Pt will decrease fascial restrictions from max to mod amount.    Status Achieved   OT SHORT TERM GOAL #4   Title Pt will increase PROM to greatest range within protocol limits.    Status Achieved   OT SHORT TERM GOAL #5   Title Pt will increase strength to 3/5 to increase ability to donn shirts.    Status Achieved           OT Long Term Goals - 01/08/15 1323    OT LONG TERM GOAL #1   Title Pt will return to previous level of function and independence in daily activities.    Baseline continued difficulty pulling up pants on left side and reaching behind his back.   Status Partially Met   OT LONG TERM GOAL #2   Title Pt will decrease pain to 1/10 or less during daily tasks.    Status Achieved   OT LONG TERM GOAL #3   Title Pt will decrease fascial restrictions from mod to min amounts or less.    Status Achieved   OT LONG TERM GOAL #4   Title Pt will increase AROM to [facility] to increase ability to reach into overhead cabinets.    Baseline can't fully internally rotate with shoulder abducted   Status Partially Met   OT LONG TERM GOAL #5   Title Pt will increase strength to 4/5 to increase ability to lift items while completing household tasks.   Baseline internal rotation is 4-/5.   Status Partially Met               Plan - 01/14/15 1501    Clinical Impression Statement A: Added 1# weight in supine and standing. Continued scapular exercises and IR stretch. Educated pt on towel stretch to complete at home. Pt reports soreness at end of session.    Plan P: Continue strengthening exercises for LUE, internal rotation stretches. Add weight to overhead lacing.          Problem List Patient Active Problem List   Diagnosis Date Noted  . 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 Encounter for therapeutic drug monitoring 05/11/2013  . Hypertension   . Hyperlipidemia   . Chronic anticoagulation   . Degenerative joint disease   .  Obesity   . TOBACCO ABUSE 03/18/2009  . Atrial fibrillation 03/18/2009    Guadelupe Sabin, OTR/L  [phone]  01/14/2015, 3:05 PM  [place] [address] [place], [place], [address] Phone: [phone]   Fax:  [phone]

## 2015-01-16 ENCOUNTER — Ambulatory Visit (HOSPITAL_COMMUNITY): Payer: BLUE CROSS/BLUE SHIELD

## 2015-01-16 ENCOUNTER — Encounter (HOSPITAL_COMMUNITY): Payer: Self-pay

## 2015-01-16 DIAGNOSIS — M629 Disorder of muscle, unspecified: Secondary | ICD-10-CM

## 2015-01-16 DIAGNOSIS — M6289 Other specified disorders of muscle: Secondary | ICD-10-CM

## 2015-01-16 DIAGNOSIS — M25612 Stiffness of left shoulder, not elsewhere classified: Secondary | ICD-10-CM

## 2015-01-16 DIAGNOSIS — R29898 Other symptoms and signs involving the musculoskeletal system: Secondary | ICD-10-CM

## 2015-01-16 NOTE — Therapy (Signed)
[facility] [address] [place], [place], [address] Phone: [phone]   Fax:  [phone]  Occupational Therapy Treatment  Patient Details  Name: Tony [name] MRN: [id number] Date of Birth: [birth year] Referring Provider:  Francena Hanly, MD  Encounter Date: 01/16/2015      OT End of Session - 01/16/15 1526    Visit Number 19   Number of Visits 24   Date for OT Re-Evaluation 03/09/15  02/06/15   Authorization Type BCBS   Authorization Time Period Pt has visit limit of 30, has used 8 as of 11/08/14   Authorization - Visit Number 27   Authorization - Number of Visits 30   OT Start Time 1350   OT Stop Time 1430   OT Time Calculation (min) 40 min   Activity Tolerance Patient tolerated treatment well   Behavior During Therapy [facility] for tasks assessed/performed      Past Medical History  Diagnosis Date  . Hypertension   . Hyperlipidemia   . Atrial fibrillation     unknown onset  . Chronic anticoagulation   . Tobacco abuse   . Degenerative joint disease     Left TKA; right shoulder surgery also advised  . Obesity   . Dysrhythmia   . COPD (chronic obstructive pulmonary disease)   . Shortness of breath dyspnea   . Sleep apnea     Past Surgical History  Procedure Laterality Date  . Total knee arthroplasty      Left  . Joint replacement    . Shoulder surgery    . 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  Visit Diagnosis:  Shoulder joint stiffness, left  Tight fascia  Left arm weakness      Subjective Assessment - 01/16/15 1415    Subjective  S: The only time it hurts is when I am reaching behind my back.   Currently in Pain? No/denies                      OT Treatments/Exercises (OP) - 01/16/15 1408    Exercises   Exercises Shoulder;Elbow   Shoulder Exercises: Supine   Protraction PROM;5 reps;Strengthening;12 reps   Protraction Weight (lbs) 1   Horizontal ABduction PROM;5 reps;Strengthening;12 reps   Horizontal ABduction Weight (lbs) 1   External Rotation PROM;5 reps;Strengthening;12 reps  abduction   External Rotation Weight (lbs) 1   Internal Rotation PROM;5 reps;Strengthening;12 reps  abduction   Internal Rotation Weight (lbs) 1   Flexion PROM;5 reps;Strengthening;12 reps   Shoulder Flexion Weight (lbs) 1   ABduction PROM;5 reps;Strengthening;12 reps   Shoulder ABduction Weight (lbs) 1   Shoulder Exercises: Seated   Extension Theraband;10 reps   Theraband Level (Shoulder Extension) Level 3 (Green)   Row [employer] reps   Theraband Level (Shoulder Row) Level 3 (Green)   Protraction Strengthening;10 reps   Protraction Weight (lbs) 1   Horizontal ABduction Strengthening;10 reps   Horizontal ABduction Weight (lbs) 1   External Rotation Strengthening;10 reps   External Rotation Weight (lbs) 1   Internal Rotation Theraband;10 reps   Theraband Level (Shoulder Internal Rotation) Level 3 (Green)   Flexion Strengthening;10 reps   Flexion Weight (lbs) 1   Shoulder Exercises: ROM/Strengthening   Over Head Lace 2' with 1# wrist weight  X to V Arms 10X with 1#   Proximal Shoulder Strengthening, Supine 10X with 1# no rest breaks   Shoulder Exercises: Stretch   Internal Rotation Stretch 3 reps  10 seconds. therapist facilitating.    Manual Therapy   Manual Therapy Myofascial release   Myofascial Release Myofascial release to left upper arm, trapezius, and scapularis region to decrease fascial restrictions and increase joint mobility in a pain free zone.                   OT Short Term Goals - 01/08/15 1322    OT SHORT TERM GOAL #1   Title Pt will be educated on and independent in HEP.    Status Achieved   OT SHORT TERM GOAL #2   Title Pt will decrease pain to 3/10  or less during daily tasks.    Status Achieved   OT SHORT TERM GOAL #3   Title Pt will decrease fascial restrictions from max to mod amount.    Status Achieved   OT SHORT TERM GOAL #4   Title Pt will increase PROM to greatest range within protocol limits.    Status Achieved   OT SHORT TERM GOAL #5   Title Pt will increase strength to 3/5 to increase ability to donn shirts.    Status Achieved           OT Long Term Goals - 01/08/15 1323    OT LONG TERM GOAL #1   Title Pt will return to previous level of function and independence in daily activities.    Baseline continued difficulty pulling up pants on left side and reaching behind his back.   Status Partially Met   OT LONG TERM GOAL #2   Title Pt will decrease pain to 1/10 or less during daily tasks.    Status Achieved   OT LONG TERM GOAL #3   Title Pt will decrease fascial restrictions from mod to min amounts or less.    Status Achieved   OT LONG TERM GOAL #4   Title Pt will increase AROM to [facility] to increase ability to reach into overhead cabinets.    Baseline can't fully internally rotate with shoulder abducted   Status Partially Met   OT LONG TERM GOAL #5   Title Pt will increase strength to 4/5 to increase ability to lift items while completing household tasks.   Baseline internal rotation is 4-/5.   Status Partially Met               Plan - 01/16/15 1527    Clinical Impression Statement A: Added weight overhead during lacing activity. Completed iR and ER stretches with OTR facilitating. Pt tolerated well. Muscle fatigue noted during end of session.    Plan P: Review insurance coverage. Last session patient was at visit 27 of 30 visits covered. Let patient know that if he is unable to continue paying out of pocket we will plan on discharging at 30th visit with an updated HEP.         Problem List Patient Active Problem List   Diagnosis Date Noted  . 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 Encounter for  therapeutic drug monitoring 05/11/2013  . Hypertension   . Hyperlipidemia   . Chronic anticoagulation   . Degenerative joint disease   . Obesity   . TOBACCO ABUSE 03/18/2009  . Atrial fibrillation 03/18/2009    Ailene Ravel, OTR/L,CBIS  [phone]  01/16/2015, 4:25 PM  [facility]  [place] [address] [place], [place], [address] Phone: [phone]   Fax:  [phone]

## 2015-01-21 ENCOUNTER — Ambulatory Visit (HOSPITAL_COMMUNITY): Payer: BLUE CROSS/BLUE SHIELD | Attending: Orthopedic Surgery | Admitting: Occupational Therapy

## 2015-01-21 ENCOUNTER — Encounter (HOSPITAL_COMMUNITY): Payer: Self-pay | Admitting: Occupational Therapy

## 2015-01-21 DIAGNOSIS — M25612 Stiffness of left shoulder, not elsewhere classified: Secondary | ICD-10-CM | POA: Diagnosis not present

## 2015-01-21 DIAGNOSIS — R29898 Other symptoms and signs involving the musculoskeletal system: Secondary | ICD-10-CM | POA: Diagnosis present

## 2015-01-21 DIAGNOSIS — M25512 Pain in left shoulder: Secondary | ICD-10-CM | POA: Insufficient documentation

## 2015-01-21 DIAGNOSIS — M629 Disorder of muscle, unspecified: Secondary | ICD-10-CM

## 2015-01-21 DIAGNOSIS — M6289 Other specified disorders of muscle: Secondary | ICD-10-CM

## 2015-01-21 NOTE — Therapy (Signed)
[place], [place], [address] Phone: [phone]   Fax:  [phone]  Occupational Therapy Treatment  Patient Details  Name: Tony [name] MRN: [id number] Date of Birth: 1950/08/17 Referring Provider:  Justice Britain, MD  Encounter Date: 01/21/2015      OT End of Session - 01/21/15 1409    Visit Number 20   Number of Visits 24   Date for OT Re-Evaluation 03/09/15  02/06/15   Authorization Type BCBS   Authorization Time Period Pt has visit limit of 30, has used 8 as of 11/08/14   Authorization - Visit Number 28   Authorization - Number of Visits 30   OT Start Time 6767   OT Stop Time 1345   OT Time Calculation (min) 42 min   Activity Tolerance Patient tolerated treatment well   Behavior During Therapy [facility] for tasks assessed/performed      Past Medical History  Diagnosis Date  . Hypertension   . Hyperlipidemia   . Atrial fibrillation ([place])     unknown onset  . Chronic anticoagulation   . Tobacco abuse   . Degenerative joint disease     Left TKA; right shoulder surgery also advised  . Obesity   . Dysrhythmia   . COPD (chronic obstructive pulmonary disease) ([place])   . Shortness of breath dyspnea   . Sleep apnea     Past Surgical History  Procedure Laterality Date  . Total knee arthroplasty      Left  . Joint replacement    . Shoulder surgery    . Reverse shoulder arthroplasty Left 10/17/2014    Procedure: LEFT REVERSE SHOULDER ARTHROPLASTY;  Surgeon: Justice Britain, MD;  Location: [facility];  Service: Orthopedics;  Laterality: Left;    There were no vitals filed for this visit.  Visit Diagnosis:  Shoulder joint stiffness, left  Tight fascia  Left arm weakness  Shoulder pain, left      Subjective Assessment - 01/21/15 1304    Subjective  S: I'll find out what the doctor says on Friday.    Currently in Pain? No/denies            [facility] OT Assessment - 01/21/15 1304    Assessment    Diagnosis Left reverse TSA   Precautions   Precautions Shoulder   Type of Shoulder Precautions Per MD: Begin ROM full range. Progress as tolerated.                   OT Treatments/Exercises (OP) - 01/21/15 1307    Exercises   Exercises Shoulder;Elbow   Shoulder Exercises: Supine   Protraction PROM;5 reps;Strengthening;12 reps   Protraction Weight (lbs) 1   Horizontal ABduction PROM;5 reps;Strengthening;12 reps   Horizontal ABduction Weight (lbs) 1   External Rotation PROM;5 reps;Strengthening;12 reps   External Rotation Weight (lbs) 1   Internal Rotation PROM;5 reps;Strengthening;12 reps   Internal Rotation Weight (lbs) 1   Flexion PROM;5 reps;Strengthening;12 reps   Shoulder Flexion Weight (lbs) 1   ABduction PROM;5 reps;Strengthening;12 reps   Shoulder ABduction Weight (lbs) 1   Other Supine Exercises Serratus anterior punches 12X with 1# weight   Shoulder Exercises: Seated   Protraction Strengthening;12 reps   Protraction Weight (lbs) 1   Horizontal ABduction Strengthening;12 reps   Horizontal ABduction Weight (lbs) 1   External Rotation Strengthening;12 reps   External Rotation Weight (lbs) 1   Internal Rotation Strengthening;12 reps   Flexion Strengthening;12 reps  Flexion Weight (lbs) 1   Abduction Strengthening;12 reps   ABduction Weight (lbs) 1   Shoulder Exercises: Standing   Extension Theraband;12 reps   Theraband Level (Shoulder Extension) Level 3 (Green)   Row Theraband;12 reps   Theraband Level (Shoulder Row) Level 3 (Green)   Retraction Theraband;12 reps   Theraband Level (Shoulder Retraction) Level 3 (Green)   Shoulder Exercises: ROM/Strengthening   UBE (Upper Arm Bike) Level 3 3' forward 3' reverse   Cybex Press 1.5 plate;10 reps   Cybex Row 1.5 plate;10 reps   X to V Arms 10X with 1#   Proximal Shoulder Strengthening, Supine 10X with 1# no rest breaks   Proximal Shoulder Strengthening, Seated 10X with 1# weight, no rest break   Other  ROM/Strengthening Exercises using tband therapist assist arm into internal rotation behind back and held at max stretch 5" X 3 repetitions   Manual Therapy   Manual Therapy Myofascial release   Myofascial Release Myofascial release to left upper arm, trapezius, and scapularis region to decrease fascial restrictions and increase joint mobility in a pain free zone.                   OT Short Term Goals - 01/08/15 1322    OT SHORT TERM GOAL #1   Title Pt will be educated on and independent in HEP.    Status Achieved   OT SHORT TERM GOAL #2   Title Pt will decrease pain to 3/10 or less during daily tasks.    Status Achieved   OT SHORT TERM GOAL #3   Title Pt will decrease fascial restrictions from max to mod amount.    Status Achieved   OT SHORT TERM GOAL #4   Title Pt will increase PROM to greatest range within protocol limits.    Status Achieved   OT SHORT TERM GOAL #5   Title Pt will increase strength to 3/5 to increase ability to donn shirts.    Status Achieved           OT Long Term Goals - 01/08/15 1323    OT LONG TERM GOAL #1   Title Pt will return to previous level of function and independence in daily activities.    Baseline continued difficulty pulling up pants on left side and reaching behind his back.   Status Partially Met   OT LONG TERM GOAL #2   Title Pt will decrease pain to 1/10 or less during daily tasks.    Status Achieved   OT LONG TERM GOAL #3   Title Pt will decrease fascial restrictions from mod to min amounts or less.    Status Achieved   OT LONG TERM GOAL #4   Title Pt will increase AROM to [facility] to increase ability to reach into overhead cabinets.    Baseline can't fully internally rotate with shoulder abducted   Status Partially Met   OT LONG TERM GOAL #5   Title Pt will increase strength to 4/5 to increase ability to lift items while completing household tasks.   Baseline internal rotation is 4-/5.   Status Partially Met                Plan - 01/21/15 1410    Clinical Impression Statement A: Discussed insurance coverage and remaining visits with pt. He goes to MD on Friday and will let us know what he is going to do. Added cybex row/press. Muscle fatigue noted during session. Pt reports he is not  sleeping well but is unsure if due to shoulder discomfort or sleep apnea.    Plan P: Follow up with MD appt and decide if going to discharge after 30th visit or if going to private pay. Continue strengthening exercises.         Problem List Patient Active Problem List   Diagnosis Date Noted  . 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 Encounter for therapeutic drug monitoring 05/11/2013  . Hypertension   . Hyperlipidemia   . Chronic anticoagulation   . Degenerative joint disease   . Obesity   . TOBACCO ABUSE 03/18/2009  . Atrial fibrillation [facility]) 03/18/2009    Guadelupe Sabin, OTR/L  [phone]  01/21/2015, 2:16 PM  [place] [address] [place], [place], [address] Phone: [phone]   Fax:  [phone]

## 2015-01-23 ENCOUNTER — Ambulatory Visit (HOSPITAL_COMMUNITY): Payer: BLUE CROSS/BLUE SHIELD

## 2015-01-23 ENCOUNTER — Encounter (HOSPITAL_COMMUNITY): Payer: Self-pay

## 2015-01-23 DIAGNOSIS — M25612 Stiffness of left shoulder, not elsewhere classified: Secondary | ICD-10-CM | POA: Diagnosis not present

## 2015-01-23 DIAGNOSIS — M629 Disorder of muscle, unspecified: Secondary | ICD-10-CM

## 2015-01-23 DIAGNOSIS — R29898 Other symptoms and signs involving the musculoskeletal system: Secondary | ICD-10-CM

## 2015-01-23 DIAGNOSIS — M6289 Other specified disorders of muscle: Secondary | ICD-10-CM

## 2015-01-23 NOTE — Therapy (Signed)
[place], [place], [address] Phone: [phone]   Fax:  [phone]  Occupational Therapy Treatment  Patient Details  Name: Tony [name] MRN: [id number] Date of Birth: October 14, 1950 Referring Provider:  Justice Britain, MD  Encounter Date: 01/23/2015      OT End of Session - 01/23/15 1549    Visit Number 21   Number of Visits 24   Date for OT Re-Evaluation 03/09/15  02/06/15   Authorization Type BCBS   Authorization Time Period Pt has visit limit of 30, has used 8 as of 11/08/14   Authorization - Visit Number 62   Authorization - Number of Visits 30   OT Start Time 1300   OT Stop Time 1345   OT Time Calculation (min) 45 min   Activity Tolerance Patient tolerated treatment well   Behavior During Therapy [facility] for tasks assessed/performed      Past Medical History  Diagnosis Date  . Hypertension   . Hyperlipidemia   . Atrial fibrillation ([place])     unknown onset  . Chronic anticoagulation   . Tobacco abuse   . Degenerative joint disease     Left TKA; right shoulder surgery also advised  . Obesity   . Dysrhythmia   . COPD (chronic obstructive pulmonary disease) ([place])   . Shortness of breath dyspnea   . Sleep apnea     Past Surgical History  Procedure Laterality Date  . Total knee arthroplasty      Left  . Joint replacement    . Shoulder surgery    . Reverse shoulder arthroplasty Left 10/17/2014    Procedure: LEFT REVERSE SHOULDER ARTHROPLASTY;  Surgeon: Justice Britain, MD;  Location: [facility];  Service: Orthopedics;  Laterality: Left;    There were no vitals filed for this visit.  Visit Diagnosis:  Shoulder joint stiffness, left  Tight fascia  Left arm weakness      Subjective Assessment - 01/23/15 1543    Subjective  S: I see the doctor on friday.   Currently in Pain? No/denies            [facility] OT Assessment - 01/23/15 1318    Assessment   Diagnosis Left reverse TSA   Precautions    Precautions Shoulder   Type of Shoulder Precautions Per MD: Begin ROM full range. Progress as tolerated.    AROM   Overall AROM Comments Assessed seated/ IR/er abducted   AROM Assessment Site Shoulder   Right/Left Shoulder Left   Left Shoulder Flexion 166 Degrees  previous: 165   Left Shoulder ABduction 176 Degrees  previous: 176   Left Shoulder Internal Rotation 90 Degrees  previous: 90   Left Shoulder External Rotation 45 Degrees  previous: 80   Strength   Overall Strength Comments assessed in seated   Strength Assessment Site Shoulder   Right/Left Shoulder Left   Left Shoulder Flexion 5/5  previous: 4+/5   Left Shoulder ABduction 5/5  previous: 4+/5   Left Shoulder Internal Rotation 4+/5  previous: 4-/5   Left Shoulder External Rotation 5/5  previous: 4+/5                  OT Treatments/Exercises (OP) - 01/23/15 1323    Exercises   Exercises Shoulder;Elbow   Shoulder Exercises: Seated   Protraction Strengthening;10 reps   Protraction Weight (lbs) 2   Horizontal ABduction Strengthening;10 reps   Horizontal ABduction Weight (lbs) 2   External Rotation Strengthening;10 reps  External Rotation Weight (lbs) 2   Internal Rotation Strengthening;10 reps   Internal Rotation Weight (lbs) 2   Flexion Strengthening;10 reps   Flexion Weight (lbs) 2   Abduction Strengthening;10 reps   ABduction Weight (lbs) 2   Shoulder Exercises: Standing   Extension Theraband;12 reps   Theraband Level (Shoulder Extension) Level 3 (Green)   Row [employer] reps   Theraband Level (Shoulder Row) Level 3 (Green)   Retraction Theraband;12 reps   Theraband Level (Shoulder Retraction) Level 3 (Green)   Shoulder Exercises: ROM/Strengthening   Cybex Press 1.5 plate;15 reps   Cybex Row 1.5 plate;15 reps   Over Head Lace 2' with 1.5# wrist weight   Pushups 10 reps  using countertop   X to V Arms 10X with 2#   Proximal Shoulder Strengthening, Seated 10X with 2# weight, no rest break    Ball on Wall 1' flexion 1' abduction green ball   Manual Therapy   Manual Therapy Myofascial release   Myofascial Release Myofascial release to left upper arm, trapezius, and scapularis region to decrease fascial restrictions and increase joint mobility in a pain free zone.                   OT Short Term Goals - 01/08/15 1322    OT SHORT TERM GOAL #1   Title Pt will be educated on and independent in HEP.    Status Achieved   OT SHORT TERM GOAL #2   Title Pt will decrease pain to 3/10 or less during daily tasks.    Status Achieved   OT SHORT TERM GOAL #3   Title Pt will decrease fascial restrictions from max to mod amount.    Status Achieved   OT SHORT TERM GOAL #4   Title Pt will increase PROM to greatest range within protocol limits.    Status Achieved   OT SHORT TERM GOAL #5   Title Pt will increase strength to 3/5 to increase ability to donn shirts.    Status Achieved           OT Long Term Goals - 01/08/15 1323    OT LONG TERM GOAL #1   Title Pt will return to previous level of function and independence in daily activities.    Baseline continued difficulty pulling up pants on left side and reaching behind his back.   Status Partially Met   OT LONG TERM GOAL #2   Title Pt will decrease pain to 1/10 or less during daily tasks.    Status Achieved   OT LONG TERM GOAL #3   Title Pt will decrease fascial restrictions from mod to min amounts or less.    Status Achieved   OT LONG TERM GOAL #4   Title Pt will increase AROM to [facility] to increase ability to reach into overhead cabinets.    Baseline can't fully internally rotate with shoulder abducted   Status Partially Met   OT LONG TERM GOAL #5   Title Pt will increase strength to 4/5 to increase ability to lift items while completing household tasks.   Baseline internal rotation is 4-/5.   Status Partially Met               Plan - 01/23/15 1549    Clinical Impression Statement A: Measurements taken for  MD appt tomorrow. Sent MD letter on 01/23/15.    Plan P: Update G code. Reassess (measurements were taken on 01/23/15). Update HEP as needed for discharge.  Problem List Patient Active Problem List   Diagnosis Date Noted  . 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 Encounter for therapeutic drug monitoring 05/11/2013  . Hypertension   . Hyperlipidemia   . Chronic anticoagulation   . Degenerative joint disease   . Obesity   . TOBACCO ABUSE 03/18/2009  . Atrial fibrillation [facility]) 03/18/2009    Ailene Ravel, OTR/L,CBIS  [phone]  01/23/2015, 3:51 PM  [place] [address] [place], [place], [address] Phone: [phone]   Fax:  [phone]

## 2015-01-27 ENCOUNTER — Ambulatory Visit (INDEPENDENT_AMBULATORY_CARE_PROVIDER_SITE_OTHER): Payer: BLUE CROSS/BLUE SHIELD | Admitting: *Deleted

## 2015-01-27 DIAGNOSIS — I4891 Unspecified atrial fibrillation: Secondary | ICD-10-CM | POA: Diagnosis not present

## 2015-01-27 DIAGNOSIS — Z5181 Encounter for therapeutic drug level monitoring: Secondary | ICD-10-CM

## 2015-01-27 LAB — POCT INR: INR: 2.4

## 2015-01-28 ENCOUNTER — Ambulatory Visit (HOSPITAL_COMMUNITY): Payer: BLUE CROSS/BLUE SHIELD | Admitting: Specialist

## 2015-01-28 DIAGNOSIS — M25612 Stiffness of left shoulder, not elsewhere classified: Secondary | ICD-10-CM

## 2015-01-28 DIAGNOSIS — R29898 Other symptoms and signs involving the musculoskeletal system: Secondary | ICD-10-CM

## 2015-01-28 NOTE — Therapy (Signed)
[place], [place], [address] Phone: [phone]   Fax:  [phone]  Occupational Therapy Treatment  Patient Details  Name: Tony [name] MRN: [id number] Date of Birth: Nov 11, 1950 Referring Provider:  Justice Britain, MD  Encounter Date: 01/28/2015      OT End of Session - 01/28/15 2106    Visit Number 22   Number of Visits 24   Date for OT Re-Evaluation 03/09/15   Authorization Type BCBS   Authorization Time Period Pt has visit limit of 30, has used 8 as of 11/08/14   Authorization - Visit Number 28   Authorization - Number of Visits 30   OT Start Time 1400   OT Stop Time 1438   OT Time Calculation (min) 38 min   Activity Tolerance Patient tolerated treatment well   Behavior During Therapy [facility] for tasks assessed/performed      Past Medical History  Diagnosis Date  . Hypertension   . Hyperlipidemia   . Atrial fibrillation ([place])     unknown onset  . Chronic anticoagulation   . Tobacco abuse   . Degenerative joint disease     Left TKA; right shoulder surgery also advised  . Obesity   . Dysrhythmia   . COPD (chronic obstructive pulmonary disease) ([place])   . Shortness of breath dyspnea   . Sleep apnea     Past Surgical History  Procedure Laterality Date  . Total knee arthroplasty      Left  . Joint replacement    . Shoulder surgery    . Reverse shoulder arthroplasty Left 10/17/2014    Procedure: LEFT REVERSE SHOULDER ARTHROPLASTY;  Surgeon: Justice Britain, MD;  Location: [place];  Service: Orthopedics;  Laterality: Left;    There were no vitals filed for this visit.  Visit Diagnosis:  Shoulder joint stiffness, left  Left arm weakness      Subjective Assessment - 01/28/15 2100    Subjective  S: About the only thing I cant do is reach behind my back, like when I pull up my britches.  I go back to work on the 27th with restrictions.    Currently in Pain? No/denies            [facility] OT  Assessment - 01/28/15 0001    Assessment   Diagnosis Left reverse TSA   Precautions   Precautions Shoulder   Type of Shoulder Precautions Per MD: Begin ROM full range. Progress as tolerated.    ADL   ADL comments Patient can do most daily activities.  He has increased pain and difficulty reaching behind his back to pull his pants up or to tuck in his shirt.  He returns to work on 02/13/15   Palpation   Palpation comment min-trace fascial restrictions   AROM   Overall AROM Comments Assessed seated/ IR/er abducted   AROM Assessment Site Shoulder   Right/Left Shoulder Left   Left Shoulder Flexion 166 Degrees  165   Left Shoulder ABduction 176 Degrees  176   Left Shoulder Internal Rotation 90 Degrees  90   Left Shoulder External Rotation 45 Degrees   PROM   Overall PROM Comments WNL in supine, External Rotation in abducted position WFL, internal rotation is limited to 80% of right arm   Strength   Overall Strength Comments assessed in seated   Left Shoulder Flexion 5/5   Left Shoulder ABduction 5/5   Left Shoulder Internal Rotation 4+/5   Left Shoulder  External Rotation 5/5                  OT Treatments/Exercises (OP) - 01/28/15 0001    Shoulder Exercises: Stretch   Internal Rotation Stretch --  issued and completed 5 internal rotation stretches for HEP.   Manual Therapy   Manual Therapy Myofascial release   Myofascial Release Myofascial release to left upper arm, trapezius, and scapularis region to decrease fascial restrictions and increase joint mobility in a pain free zone.                 OT Education - 01/28/15 2105    Education provided Yes   Education Details educated on 4 different stretches, all witch target internal rotation needed to reach and pull up pants or tuck his shirt in.   Person(s) Educated Patient   Methods Explanation;Demonstration;Handout   Comprehension Verbalized understanding;Returned demonstration          OT Short Term  Goals - 01/08/15 1322    OT SHORT TERM GOAL #1   Title Pt will be educated on and independent in HEP.    Status Achieved   OT SHORT TERM GOAL #2   Title Pt will decrease pain to 3/10 or less during daily tasks.    Status Achieved   OT SHORT TERM GOAL #3   Title Pt will decrease fascial restrictions from max to mod amount.    Status Achieved   OT SHORT TERM GOAL #4   Title Pt will increase PROM to greatest range within protocol limits.    Status Achieved   OT SHORT TERM GOAL #5   Title Pt will increase strength to 3/5 to increase ability to donn shirts.    Status Achieved           OT Long Term Goals - 01/08/15 1323    OT LONG TERM GOAL #1   Title Pt will return to previous level of function and independence in daily activities.    Baseline continued difficulty pulling up pants on left side and reaching behind his back.   Status Partially Met   OT LONG TERM GOAL #2   Title Pt will decrease pain to 1/10 or less during daily tasks.    Status Achieved   OT LONG TERM GOAL #3   Title Pt will decrease fascial restrictions from mod to min amounts or less.    Status Achieved   OT LONG TERM GOAL #4   Title Pt will increase AROM to [facility] to increase ability to reach into overhead cabinets.    Baseline can't fully internally rotate with shoulder abducted   Status Partially Met   OT LONG TERM GOAL #5   Title Pt will increase strength to 4/5 to increase ability to lift items while completing household tasks.   Baseline internal rotation is 4-/5.   Status Partially Met               Plan - 01/28/15 2106    Clinical Impression Statement A:  Due to insurance authorization (today is the 30th and last authorized therapy visit) patient has opted for discharge from skilled OT intervention.  He has functional or better use of his RUE except for internal rotation needed to reach behind his back to tuck in a shirt or pull his pants up.  OTRL issued HEP for inernal rotation stretches to  improve independence with his movement.     Plan P:  DC from skilled OT intervention this date due  to patient request due to financial/insurance reasons.         Problem List Patient Active Problem List   Diagnosis Date Noted  . 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 Encounter for therapeutic drug monitoring 05/11/2013  . Hypertension   . Hyperlipidemia   . Chronic anticoagulation   . Degenerative joint disease   . Obesity   . TOBACCO ABUSE 03/18/2009  . Atrial fibrillation [facility]) 03/18/2009    Vangie Bicker, OTR/L [phone]  01/28/2015, 9:10 PM  [place], [place], [address] Phone: [phone]   Fax:  [phone]   OCCUPATIONAL THERAPY DISCHARGE SUMMARY  Visits from Start of Care: 22  Current functional level related to goals / functional outcomes: Able to complete all B/IADLs using his right arm as dominant, ecept for pulling up pants and tucking his shirt in the back.  Issued HEP to continue to improve External rotation.     Remaining deficits: Decreased internal rotation, has not returned to work and will return with light duty restrictions on 02/13/15   Education / Equipment: Internal rotation stretches.   Plan:                                                    Patient goals were partially met. Patient is being discharged due to financial reasons.  ?????       And insurance authorization.  Patient able to complete all daily activities without difficulty except internal rotation which effects tucking in his shirt and pulling up his pants.  Vangie Bicker, OTR/L [phone]

## 2015-01-28 NOTE — Patient Instructions (Signed)
Internal Rotation    Stand with hands clasped behind back. Pull elbows back as far as possible. Hold ____ seconds. Repeat ____ times. Do ____ sessions per day.  Copyright  VHI. All rights reserved.  Closed Chain: Shoulder Internal Rotation - on Wall    One hand on wall, rotate shoulder by stepping in and across. Step ___ times, ___ times per day.  http://ss.exer.us/268   Copyright  VHI. All rights reserved.  Closed Chain: Shoulder External / Internal Rotation - Counter Level    One hand on counter, rotate shoulder by stepping outward and backward, then inward and across. Do ___ times, each foot, ___ times per day.  http://ss.exer.us/260   Copyright  VHI. All rights reserved.  Chest / Bicep Stretch    Lace fingers behind back and squeeze shoulder blades together. Slowly raise and straighten arms. Hold ____ seconds. Repeat ____ times per set. Do ____ sets per session. Do ____ sessions per day.  http://orth.exer.us/352   Copyright  VHI. All rights reserved.  Catch / Stretch / Throw    Sit at table with left arm supported. Catch ball and throw back to partner. Repeat __ times. Repeat with other arm for set. Rest __ seconds after set. Do __ sets per session.  Copyright  VHI. All rights reserved.

## 2015-01-30 ENCOUNTER — Encounter (HOSPITAL_COMMUNITY): Payer: BLUE CROSS/BLUE SHIELD

## 2015-02-04 ENCOUNTER — Encounter (HOSPITAL_COMMUNITY): Payer: BLUE CROSS/BLUE SHIELD | Admitting: Occupational Therapy

## 2015-02-06 ENCOUNTER — Encounter (HOSPITAL_COMMUNITY): Payer: BLUE CROSS/BLUE SHIELD

## 2015-02-07 ENCOUNTER — Other Ambulatory Visit: Payer: Self-pay

## 2015-02-07 ENCOUNTER — Telehealth: Payer: Self-pay | Admitting: Cardiology

## 2015-02-07 MED ORDER — ATORVASTATIN CALCIUM 40 MG PO TABS: 40.0000 mg | ORAL_TABLET | Freq: Every day | ORAL | Status: DC

## 2015-02-07 MED ORDER — DILTIAZEM HCL ER COATED BEADS 240 MG PO CP24: ORAL_CAPSULE | ORAL | Status: DC

## 2015-02-07 MED ORDER — LOSARTAN POTASSIUM-HCTZ 100-12.5 MG PO TABS: 1.0000 | ORAL_TABLET | Freq: Every day | ORAL | Status: DC

## 2015-02-07 NOTE — Telephone Encounter (Signed)
°  STAT if patient is at the pharmacy , call can be transferred to refill team.   1. Which medications need to be refilled? Patient states all his medications need to be refilled  2. Which pharmacy/location is medication to be sent to? [facility] [place]   3. Do they need a 30 day or 90 day supply? 30 day

## 2015-02-11 ENCOUNTER — Encounter (HOSPITAL_COMMUNITY): Payer: BLUE CROSS/BLUE SHIELD | Admitting: Occupational Therapy

## 2015-02-13 ENCOUNTER — Encounter (HOSPITAL_COMMUNITY): Payer: BLUE CROSS/BLUE SHIELD | Admitting: Occupational Therapy

## 2015-02-18 ENCOUNTER — Encounter (HOSPITAL_COMMUNITY): Payer: BLUE CROSS/BLUE SHIELD

## 2015-02-20 ENCOUNTER — Encounter (HOSPITAL_COMMUNITY): Payer: BLUE CROSS/BLUE SHIELD

## 2015-02-24 ENCOUNTER — Ambulatory Visit (INDEPENDENT_AMBULATORY_CARE_PROVIDER_SITE_OTHER): Payer: BLUE CROSS/BLUE SHIELD | Admitting: *Deleted

## 2015-02-24 DIAGNOSIS — Z5181 Encounter for therapeutic drug level monitoring: Secondary | ICD-10-CM

## 2015-02-24 DIAGNOSIS — I4891 Unspecified atrial fibrillation: Secondary | ICD-10-CM | POA: Diagnosis not present

## 2015-02-24 LAB — POCT INR: INR: 2.5

## 2015-02-24 MED ORDER — DILTIAZEM HCL ER COATED BEADS 240 MG PO CP24: ORAL_CAPSULE | ORAL | Status: DC

## 2015-02-24 MED ORDER — WARFARIN SODIUM 10 MG PO TABS: ORAL_TABLET | ORAL | Status: DC

## 2015-02-24 MED ORDER — ATORVASTATIN CALCIUM 40 MG PO TABS: 40.0000 mg | ORAL_TABLET | Freq: Every day | ORAL | Status: DC

## 2015-02-24 MED ORDER — LOSARTAN POTASSIUM-HCTZ 100-12.5 MG PO TABS: 1.0000 | ORAL_TABLET | Freq: Every day | ORAL | Status: DC

## 2015-02-25 ENCOUNTER — Telehealth: Payer: Self-pay | Admitting: Neurology

## 2015-02-25 ENCOUNTER — Ambulatory Visit: Payer: Self-pay | Admitting: Neurology

## 2015-02-25 ENCOUNTER — Encounter (HOSPITAL_COMMUNITY): Payer: BLUE CROSS/BLUE SHIELD | Admitting: Occupational Therapy

## 2015-02-25 NOTE — Telephone Encounter (Signed)
Patient no showed for an appointment today. Please get in touch with him to discuss starting CPAP therapy and I think it is imperative to treat his severe obstructive sleep apnea.please advise patient that untreated moderate to severe obstructive sleep apnea can lead to cardiovascular disease, including congestive heart failure, stroke, difficult to control hypertension, arrhythmias, and even type 2 diabetes has been linked to untreated OSA. Sleep apnea causes disruption of sleep and sleep deprivation in most cases, which, in turn, can cause recurrent headaches, problems with memory, mood, concentration, focus, and vigilance. Most people with untreated sleep apnea report excessive daytime sleepiness, which can affect their ability to drive. If he is willing to start CPAP therapy we can refer him to a DME company again. I would like to stress the fact that he did well during his sleep study in September on CPAP therapy and encourage him strongly to get started treatment and I will see him after a few weeks after he is set up with CPAP therapy at home.

## 2015-02-25 NOTE — Telephone Encounter (Signed)
A man answered patient's home number - stated patient was not home but he would give him the message to return our call.

## 2015-02-26 ENCOUNTER — Encounter: Payer: Self-pay | Admitting: Neurology

## 2015-02-27 ENCOUNTER — Encounter (HOSPITAL_COMMUNITY): Payer: BLUE CROSS/BLUE SHIELD | Admitting: Occupational Therapy

## 2015-03-04 ENCOUNTER — Encounter (HOSPITAL_COMMUNITY): Payer: BLUE CROSS/BLUE SHIELD

## 2015-03-06 ENCOUNTER — Encounter (HOSPITAL_COMMUNITY): Payer: BLUE CROSS/BLUE SHIELD | Admitting: Occupational Therapy

## 2015-03-11 ENCOUNTER — Encounter (HOSPITAL_COMMUNITY): Payer: BLUE CROSS/BLUE SHIELD | Admitting: Occupational Therapy

## 2015-03-11 NOTE — Telephone Encounter (Signed)
[facility] and they reported that patient declined set up at this time due to financial reasons. I will send patient a letter with Dr. Teofilo PodAthar's recommendations below.

## 2015-03-18 ENCOUNTER — Encounter (HOSPITAL_COMMUNITY): Payer: BLUE CROSS/BLUE SHIELD

## 2015-03-20 ENCOUNTER — Encounter (HOSPITAL_COMMUNITY): Payer: BLUE CROSS/BLUE SHIELD | Admitting: Occupational Therapy

## 2015-03-25 ENCOUNTER — Encounter (HOSPITAL_COMMUNITY): Payer: BLUE CROSS/BLUE SHIELD

## 2015-03-27 ENCOUNTER — Encounter (HOSPITAL_COMMUNITY): Payer: BLUE CROSS/BLUE SHIELD

## 2015-04-01 ENCOUNTER — Encounter (HOSPITAL_COMMUNITY): Payer: BLUE CROSS/BLUE SHIELD | Admitting: Occupational Therapy

## 2015-04-03 ENCOUNTER — Encounter (HOSPITAL_COMMUNITY): Payer: BLUE CROSS/BLUE SHIELD

## 2015-04-07 ENCOUNTER — Ambulatory Visit (INDEPENDENT_AMBULATORY_CARE_PROVIDER_SITE_OTHER): Payer: PPO | Admitting: *Deleted

## 2015-04-07 DIAGNOSIS — I4891 Unspecified atrial fibrillation: Secondary | ICD-10-CM

## 2015-04-07 DIAGNOSIS — Z5181 Encounter for therapeutic drug level monitoring: Secondary | ICD-10-CM

## 2015-04-07 LAB — POCT INR: INR: 2.1

## 2015-04-08 ENCOUNTER — Encounter (HOSPITAL_COMMUNITY): Payer: BLUE CROSS/BLUE SHIELD

## 2015-04-10 ENCOUNTER — Encounter (HOSPITAL_COMMUNITY): Payer: BLUE CROSS/BLUE SHIELD

## 2015-04-15 ENCOUNTER — Encounter (HOSPITAL_COMMUNITY): Payer: BLUE CROSS/BLUE SHIELD | Admitting: Occupational Therapy

## 2015-04-17 ENCOUNTER — Encounter (HOSPITAL_COMMUNITY): Payer: BLUE CROSS/BLUE SHIELD

## 2015-04-22 ENCOUNTER — Encounter (HOSPITAL_COMMUNITY): Payer: BLUE CROSS/BLUE SHIELD

## 2015-05-19 ENCOUNTER — Ambulatory Visit (INDEPENDENT_AMBULATORY_CARE_PROVIDER_SITE_OTHER): Payer: PPO | Admitting: *Deleted

## 2015-05-19 DIAGNOSIS — I4891 Unspecified atrial fibrillation: Secondary | ICD-10-CM

## 2015-05-19 DIAGNOSIS — Z5181 Encounter for therapeutic drug level monitoring: Secondary | ICD-10-CM | POA: Diagnosis not present

## 2015-05-19 LAB — POCT INR: INR: 2.4

## 2015-06-30 ENCOUNTER — Ambulatory Visit (INDEPENDENT_AMBULATORY_CARE_PROVIDER_SITE_OTHER): Payer: PPO | Admitting: *Deleted

## 2015-06-30 DIAGNOSIS — Z5181 Encounter for therapeutic drug level monitoring: Secondary | ICD-10-CM

## 2015-06-30 DIAGNOSIS — I4891 Unspecified atrial fibrillation: Secondary | ICD-10-CM | POA: Diagnosis not present

## 2015-06-30 LAB — POCT INR: INR: 3.1

## 2015-07-21 ENCOUNTER — Ambulatory Visit (INDEPENDENT_AMBULATORY_CARE_PROVIDER_SITE_OTHER): Payer: PPO | Admitting: *Deleted

## 2015-07-21 DIAGNOSIS — Z5181 Encounter for therapeutic drug level monitoring: Secondary | ICD-10-CM | POA: Diagnosis not present

## 2015-07-21 DIAGNOSIS — I4891 Unspecified atrial fibrillation: Secondary | ICD-10-CM

## 2015-07-21 LAB — POCT INR: INR: 2.8

## 2015-09-01 ENCOUNTER — Ambulatory Visit (INDEPENDENT_AMBULATORY_CARE_PROVIDER_SITE_OTHER): Payer: PPO | Admitting: *Deleted

## 2015-09-01 ENCOUNTER — Ambulatory Visit (INDEPENDENT_AMBULATORY_CARE_PROVIDER_SITE_OTHER): Payer: PPO | Admitting: Cardiology

## 2015-09-01 ENCOUNTER — Encounter: Payer: Self-pay | Admitting: Cardiology

## 2015-09-01 ENCOUNTER — Encounter: Payer: Self-pay | Admitting: *Deleted

## 2015-09-01 VITALS — BP 124/78 | HR 72 | Ht 69.0 in | Wt 336.0 lb

## 2015-09-01 DIAGNOSIS — I4891 Unspecified atrial fibrillation: Secondary | ICD-10-CM

## 2015-09-01 DIAGNOSIS — Z5181 Encounter for therapeutic drug level monitoring: Secondary | ICD-10-CM | POA: Diagnosis not present

## 2015-09-01 DIAGNOSIS — I482 Chronic atrial fibrillation, unspecified: Secondary | ICD-10-CM

## 2015-09-01 DIAGNOSIS — E782 Mixed hyperlipidemia: Secondary | ICD-10-CM

## 2015-09-01 DIAGNOSIS — Z136 Encounter for screening for cardiovascular disorders: Secondary | ICD-10-CM

## 2015-09-01 DIAGNOSIS — I10 Essential (primary) hypertension: Secondary | ICD-10-CM | POA: Diagnosis not present

## 2015-09-01 LAB — POCT INR: INR: 2.7

## 2015-09-01 NOTE — Patient Instructions (Signed)
Your physician wants you to follow-up in: 1 Year with Dr. Wyline MoodBranch. You will receive a reminder letter in the mail two months in advance. If you don't receive a letter, please call our office to schedule the follow-up appointment.  Your physician recommends that you continue on your current medications as directed. Please refer to the Current Medication list given to you today.  Your physician has requested that you have an abdominal aorta duplex. During this test, an ultrasound is used to evaluate the aorta. Allow 30 minutes for this exam. Do not eat after midnight the day before and avoid carbonated beverages  If you need a refill on your cardiac medications before your next appointment, please call your pharmacy.  Thank you for choosing [facility]!

## 2015-09-01 NOTE — Progress Notes (Signed)
Patient ID: Henrietta HooverGeorge R Lepp, male   DOB: February 17, 1951, 65 y.o.   MRN: [id number]     Clinical Summary Mr. Delford FieldWright is a 65 y.o.male seen today for follow up of the following medical problems.   1. Afib  - no recent palpitations recently - compliant with coumadin, denies any bleeding on anticoagulation  2. HTN  - checks occasionally at home, typically 120s/70s - compliant with meds  3. Hyperlipidemia  - compliant with lipitor  - upcoming labs in June with pcp  4. Tobacco  - quit smoking June 2016.   5. OSA - could not afford CPAP  SH: at last visit he we preparing to have shoulder surgery. Works as Product managertruck driver locally.  Past Medical History  Diagnosis Date  . Hypertension   . Hyperlipidemia   . Atrial fibrillation ([facility])     unknown onset  . Chronic anticoagulation   . Tobacco abuse   . Degenerative joint disease     Left TKA; right shoulder surgery also advised  . Obesity   . Dysrhythmia   . COPD (chronic obstructive pulmonary disease) ([facility])   . Shortness of breath dyspnea   . Sleep apnea      Allergies  Allergen Reactions  . Iodinated Diagnostic Agents Itching and Swelling    Approximately 1996     Current Outpatient Prescriptions  Medication Sig Dispense Refill  . atorvastatin (LIPITOR) 40 MG tablet Take 1 tablet (40 mg total) by mouth daily. 30 tablet 6  . diltiazem (CARDIZEM CD) 240 MG 24 hr capsule Take 1 capsule by mouth twice a day 60 capsule 6  . losartan-hydrochlorothiazide (HYZAAR) 100-12.5 MG tablet Take 1 tablet by mouth daily. 30 tablet 6  . warfarin (COUMADIN) 10 MG tablet Take 1 tablet daily except 1/2 tablet on Wednesdays or as directed 45 tablet 3   No current facility-administered medications for this visit.     Past Surgical History  Procedure Laterality Date  . Total knee arthroplasty      Left  . Joint replacement    . Shoulder surgery    . Reverse shoulder arthroplasty Left 10/17/2014    Procedure: LEFT REVERSE SHOULDER  ARTHROPLASTY;  Surgeon: Francena HanlyKevin Supple, MD;  Location: [facility] OR;  Service: Orthopedics;  Laterality: Left;     Allergies  Allergen Reactions  . Iodinated Diagnostic Agents Itching and Swelling    Approximately 1996      Family History  Problem Relation Age of Onset  . Hypertension Mother   . Alzheimer's disease Father      Social History Mr. Delford FieldWright reports that he has been smoking Cigarettes.  He started smoking about 49 years ago. He has a 30 pack-year smoking history. He has never used smokeless tobacco. Mr. Delford FieldWright reports that he drinks alcohol.   Review of Systems CONSTITUTIONAL: No weight loss, fever, chills, weakness or fatigue.  HEENT: Eyes: No visual loss, blurred vision, double vision or yellow sclerae.No hearing loss, sneezing, congestion, runny nose or sore throat.  SKIN: No rash or itching.  CARDIOVASCULAR: RRR, no m/r/g, no jvd RESPIRATORY: No shortness of breath, cough or sputum.  GASTROINTESTINAL: No anorexia, nausea, vomiting or diarrhea. No abdominal pain or blood.  GENITOURINARY: No burning on urination, no polyuria NEUROLOGICAL: No headache, dizziness, syncope, paralysis, ataxia, numbness or tingling in the extremities. No change in bowel or bladder control.  MUSCULOSKELETAL: No muscle, back pain, joint pain or stiffness.  LYMPHATICS: No enlarged nodes. No history of splenectomy.  PSYCHIATRIC: No history of depression  or anxiety.  ENDOCRINOLOGIC: No reports of sweating, cold or heat intolerance. No polyuria or polydipsia.  [name]   Physical Examination Filed Vitals:   09/01/15 0906  BP: 124/78  Pulse: 72   Filed Vitals:   09/01/15 0906  Height:  (1.753 m)  Weight: 336 lb (152.409 kg)    Gen: resting comfortably, no acute distress HEENT: no scleral icterus, pupils equal round and reactive, no palptable cervical adenopathy,  CV: irreg, no m/r/g, no jvd  Resp: Clear to auscultation bilaterally GI: abdomen is soft, non-tender, non-distended, normal  bowel sounds, no hepatosplenomegaly MSK: extremities are warm, no edema.  Skin: warm, no rash Neuro:  no focal deficits Psych: appropriate affect   Diagnostic Studies 10/2007 echo SUMMARY - Left ventricular size was at the upper limits of normal. Overall    left ventricular systolic function was normal. Left    ventricular ejection fraction was estimated to be 55 %. There    were no left ventricular regional wall motion abnormalities.    Left ventricular wall thickness was mildly increased. - There was mild to moderate mitral annular calcification. - The left atrium was moderately dilated. - Borderline right ventricular hypertrophy. - The right atrium was mild to moderately dilated    Assessment and Plan   1. Afib  - no current symptoms . EKG in clinic shows afib with controlled rate - he has not been interested in NOACs -we will continue coumadin, CHADS2Vasc score of 2  2. HTN  - at goal, we willcontinue current meds    3. Hyperlipidemia  - f/u upcoming pcp labs, continue current statin.   4. Tobacco abuse - he succesfully quit 1 year ago - will obtain AAA screening US in male former smoke age 73.    F/u 1 year  [name], M.D

## 2015-09-04 ENCOUNTER — Ambulatory Visit (HOSPITAL_COMMUNITY)
Admission: RE | Admit: 2015-09-04 | Discharge: 2015-09-04 | Disposition: A | Discharge status: Home / Self Care | Payer: PPO | Source: Ambulatory Visit | Attending: Cardiology | Admitting: Cardiology

## 2015-09-04 DIAGNOSIS — Z136 Encounter for screening for cardiovascular disorders: Secondary | ICD-10-CM | POA: Insufficient documentation

## 2015-09-04 DIAGNOSIS — I10 Essential (primary) hypertension: Secondary | ICD-10-CM | POA: Diagnosis not present

## 2015-09-04 DIAGNOSIS — I77811 Abdominal aortic ectasia: Secondary | ICD-10-CM | POA: Diagnosis not present

## 2015-09-12 ENCOUNTER — Other Ambulatory Visit: Payer: Self-pay | Admitting: Cardiology

## 2015-10-06 ENCOUNTER — Other Ambulatory Visit: Payer: Self-pay | Admitting: Cardiology

## 2015-10-13 ENCOUNTER — Ambulatory Visit (INDEPENDENT_AMBULATORY_CARE_PROVIDER_SITE_OTHER): Payer: PPO | Admitting: *Deleted

## 2015-10-13 DIAGNOSIS — Z5181 Encounter for therapeutic drug level monitoring: Secondary | ICD-10-CM | POA: Diagnosis not present

## 2015-10-13 DIAGNOSIS — I4891 Unspecified atrial fibrillation: Secondary | ICD-10-CM | POA: Diagnosis not present

## 2015-10-13 LAB — POCT INR: INR: 2.7

## 2015-11-05 ENCOUNTER — Ambulatory Visit (INDEPENDENT_AMBULATORY_CARE_PROVIDER_SITE_OTHER): Payer: PPO | Admitting: *Deleted

## 2015-11-05 DIAGNOSIS — I4891 Unspecified atrial fibrillation: Secondary | ICD-10-CM

## 2015-11-05 DIAGNOSIS — Z5181 Encounter for therapeutic drug level monitoring: Secondary | ICD-10-CM | POA: Diagnosis not present

## 2015-11-05 LAB — POCT INR: INR: 2.8

## 2015-11-17 ENCOUNTER — Telehealth: Payer: Self-pay | Admitting: *Deleted

## 2015-11-17 NOTE — Telephone Encounter (Signed)
Patient is to have tooth pulled and needs to know how long to be off of Coumadin prior to. / tg

## 2015-11-18 NOTE — Telephone Encounter (Signed)
Needs to have 1 tooth pulled.  OK to hold coumadin 2 days before procedure.  Restart coumadin night of procedure and recheck INR in 7 - 10 days.  Pt verbalized understanding.

## 2015-12-15 ENCOUNTER — Ambulatory Visit (INDEPENDENT_AMBULATORY_CARE_PROVIDER_SITE_OTHER): Payer: PPO | Admitting: *Deleted

## 2015-12-15 DIAGNOSIS — I4891 Unspecified atrial fibrillation: Secondary | ICD-10-CM

## 2015-12-15 DIAGNOSIS — Z5181 Encounter for therapeutic drug level monitoring: Secondary | ICD-10-CM

## 2015-12-15 LAB — POCT INR: INR: 2

## 2016-01-26 ENCOUNTER — Ambulatory Visit (INDEPENDENT_AMBULATORY_CARE_PROVIDER_SITE_OTHER): Payer: PPO | Admitting: *Deleted

## 2016-01-26 DIAGNOSIS — Z5181 Encounter for therapeutic drug level monitoring: Secondary | ICD-10-CM

## 2016-01-26 DIAGNOSIS — I4891 Unspecified atrial fibrillation: Secondary | ICD-10-CM | POA: Diagnosis not present

## 2016-01-26 LAB — POCT INR: INR: 2.8

## 2016-03-04 DIAGNOSIS — R062 Wheezing: Secondary | ICD-10-CM | POA: Diagnosis not present

## 2016-03-04 DIAGNOSIS — J209 Acute bronchitis, unspecified: Secondary | ICD-10-CM | POA: Diagnosis not present

## 2016-03-04 DIAGNOSIS — J069 Acute upper respiratory infection, unspecified: Secondary | ICD-10-CM | POA: Diagnosis not present

## 2016-03-04 DIAGNOSIS — Z1389 Encounter for screening for other disorder: Secondary | ICD-10-CM | POA: Diagnosis not present

## 2016-03-04 DIAGNOSIS — E782 Mixed hyperlipidemia: Secondary | ICD-10-CM | POA: Diagnosis not present

## 2016-03-04 DIAGNOSIS — J343 Hypertrophy of nasal turbinates: Secondary | ICD-10-CM | POA: Diagnosis not present

## 2016-03-04 DIAGNOSIS — Z6841 Body Mass Index (BMI) 40.0 and over, adult: Secondary | ICD-10-CM | POA: Diagnosis not present

## 2016-03-04 DIAGNOSIS — B349 Viral infection, unspecified: Secondary | ICD-10-CM | POA: Diagnosis not present

## 2016-03-04 DIAGNOSIS — E669 Obesity, unspecified: Secondary | ICD-10-CM | POA: Diagnosis not present

## 2016-03-08 ENCOUNTER — Ambulatory Visit (INDEPENDENT_AMBULATORY_CARE_PROVIDER_SITE_OTHER): Payer: PPO | Admitting: *Deleted

## 2016-03-08 DIAGNOSIS — Z5181 Encounter for therapeutic drug level monitoring: Secondary | ICD-10-CM

## 2016-03-08 DIAGNOSIS — I4891 Unspecified atrial fibrillation: Secondary | ICD-10-CM | POA: Diagnosis not present

## 2016-03-08 LAB — POCT INR: INR: 2.5

## 2016-03-19 ENCOUNTER — Other Ambulatory Visit: Payer: Self-pay | Admitting: Cardiology

## 2016-04-18 ENCOUNTER — Other Ambulatory Visit: Payer: Self-pay | Admitting: Cardiology

## 2016-04-28 ENCOUNTER — Ambulatory Visit (INDEPENDENT_AMBULATORY_CARE_PROVIDER_SITE_OTHER): Payer: PPO | Admitting: *Deleted

## 2016-04-28 DIAGNOSIS — I4891 Unspecified atrial fibrillation: Secondary | ICD-10-CM

## 2016-04-28 DIAGNOSIS — Z5181 Encounter for therapeutic drug level monitoring: Secondary | ICD-10-CM

## 2016-04-28 LAB — POCT INR: INR: 2.3

## 2016-06-09 ENCOUNTER — Encounter: Payer: Self-pay | Admitting: *Deleted

## 2016-06-11 DIAGNOSIS — J449 Chronic obstructive pulmonary disease, unspecified: Secondary | ICD-10-CM | POA: Diagnosis not present

## 2016-06-11 DIAGNOSIS — Z1389 Encounter for screening for other disorder: Secondary | ICD-10-CM | POA: Diagnosis not present

## 2016-06-11 DIAGNOSIS — J069 Acute upper respiratory infection, unspecified: Secondary | ICD-10-CM | POA: Diagnosis not present

## 2016-06-11 DIAGNOSIS — Z6841 Body Mass Index (BMI) 40.0 and over, adult: Secondary | ICD-10-CM | POA: Diagnosis not present

## 2016-06-21 ENCOUNTER — Ambulatory Visit (INDEPENDENT_AMBULATORY_CARE_PROVIDER_SITE_OTHER): Payer: PPO | Admitting: *Deleted

## 2016-06-21 DIAGNOSIS — I4891 Unspecified atrial fibrillation: Secondary | ICD-10-CM

## 2016-06-21 DIAGNOSIS — Z5181 Encounter for therapeutic drug level monitoring: Secondary | ICD-10-CM

## 2016-06-21 LAB — POCT INR: INR: 2.3

## 2016-06-21 MED ORDER — WARFARIN SODIUM 10 MG PO TABS: ORAL_TABLET | ORAL | 3 refills | Status: DC

## 2016-07-28 ENCOUNTER — Ambulatory Visit (INDEPENDENT_AMBULATORY_CARE_PROVIDER_SITE_OTHER): Payer: PPO | Admitting: *Deleted

## 2016-07-28 DIAGNOSIS — Z5181 Encounter for therapeutic drug level monitoring: Secondary | ICD-10-CM

## 2016-07-28 DIAGNOSIS — I4891 Unspecified atrial fibrillation: Secondary | ICD-10-CM

## 2016-07-28 LAB — POCT INR: INR: 3.5

## 2016-08-18 ENCOUNTER — Ambulatory Visit (INDEPENDENT_AMBULATORY_CARE_PROVIDER_SITE_OTHER): Payer: PPO | Admitting: *Deleted

## 2016-08-18 DIAGNOSIS — I4891 Unspecified atrial fibrillation: Secondary | ICD-10-CM | POA: Diagnosis not present

## 2016-08-18 DIAGNOSIS — Z5181 Encounter for therapeutic drug level monitoring: Secondary | ICD-10-CM | POA: Diagnosis not present

## 2016-08-18 LAB — POCT INR: INR: 1.7

## 2016-09-15 ENCOUNTER — Ambulatory Visit (INDEPENDENT_AMBULATORY_CARE_PROVIDER_SITE_OTHER): Payer: PPO | Admitting: *Deleted

## 2016-09-15 DIAGNOSIS — Z5181 Encounter for therapeutic drug level monitoring: Secondary | ICD-10-CM

## 2016-09-15 DIAGNOSIS — I4891 Unspecified atrial fibrillation: Secondary | ICD-10-CM

## 2016-09-15 LAB — POCT INR: INR: 2.9

## 2016-10-13 ENCOUNTER — Telehealth: Payer: Self-pay | Admitting: *Deleted

## 2016-10-13 DIAGNOSIS — I480 Paroxysmal atrial fibrillation: Secondary | ICD-10-CM | POA: Diagnosis not present

## 2016-10-13 NOTE — Telephone Encounter (Signed)
Spoke with patient.  He will be in town on Friday.  [facility].  I will call with with results and coumadin instructions on Monday 7/2.  Pt lives in [place] now.  Trying to get established with PCP there.

## 2016-10-13 NOTE — Telephone Encounter (Signed)
[name] is out of town and is unable to come for his coumdin check.  He would like to speak with [name] Please call  [phone].

## 2016-10-16 LAB — PROTIME-INR
INR: 1.6 — AB
Prothrombin Time: 17 s — ABNORMAL HIGH (ref 9.0–11.5)

## 2016-10-17 ENCOUNTER — Other Ambulatory Visit: Payer: Self-pay | Admitting: Cardiology

## 2016-10-18 ENCOUNTER — Ambulatory Visit (INDEPENDENT_AMBULATORY_CARE_PROVIDER_SITE_OTHER): Payer: PPO | Admitting: *Deleted

## 2016-10-18 DIAGNOSIS — Z5181 Encounter for therapeutic drug level monitoring: Secondary | ICD-10-CM

## 2016-10-19 ENCOUNTER — Telehealth: Payer: Self-pay | Admitting: *Deleted

## 2016-10-19 NOTE — Telephone Encounter (Signed)
Please call with Coumadin instructions / tg  °

## 2016-10-19 NOTE — Telephone Encounter (Signed)
Done.  See coumadin note. 

## 2016-11-08 ENCOUNTER — Telehealth: Payer: Self-pay | Admitting: *Deleted

## 2016-11-08 NOTE — Telephone Encounter (Signed)
Pt would like for you to give him a call

## 2016-11-08 NOTE — Telephone Encounter (Signed)
Spoke with pt. He is in town and needs INR checked.   INR appt made for tomorrow.

## 2016-11-09 ENCOUNTER — Ambulatory Visit (INDEPENDENT_AMBULATORY_CARE_PROVIDER_SITE_OTHER): Payer: PPO | Admitting: *Deleted

## 2016-11-09 DIAGNOSIS — Z5181 Encounter for therapeutic drug level monitoring: Secondary | ICD-10-CM | POA: Diagnosis not present

## 2016-11-09 DIAGNOSIS — I4891 Unspecified atrial fibrillation: Secondary | ICD-10-CM | POA: Diagnosis not present

## 2016-11-09 LAB — POCT INR: INR: 2.5

## 2016-11-09 MED ORDER — LOSARTAN POTASSIUM-HCTZ 100-12.5 MG PO TABS: 1.0000 | ORAL_TABLET | Freq: Every day | ORAL | 3 refills | Status: DC

## 2016-11-09 MED ORDER — ATORVASTATIN CALCIUM 40 MG PO TABS: 40.0000 mg | ORAL_TABLET | Freq: Every day | ORAL | 3 refills | Status: DC

## 2016-11-09 MED ORDER — WARFARIN SODIUM 10 MG PO TABS: ORAL_TABLET | ORAL | 3 refills | Status: DC

## 2016-11-09 MED ORDER — DILTIAZEM HCL ER COATED BEADS 240 MG PO CP24: 240.0000 mg | ORAL_CAPSULE | Freq: Two times a day (BID) | ORAL | 3 refills | Status: DC

## 2016-12-07 ENCOUNTER — Ambulatory Visit (INDEPENDENT_AMBULATORY_CARE_PROVIDER_SITE_OTHER): Payer: PPO | Admitting: *Deleted

## 2016-12-07 DIAGNOSIS — Z5181 Encounter for therapeutic drug level monitoring: Secondary | ICD-10-CM | POA: Diagnosis not present

## 2016-12-07 DIAGNOSIS — I4891 Unspecified atrial fibrillation: Secondary | ICD-10-CM

## 2016-12-07 LAB — POCT INR: INR: 2.9

## 2016-12-27 ENCOUNTER — Ambulatory Visit: Payer: PPO | Admitting: Cardiology

## 2017-01-24 ENCOUNTER — Ambulatory Visit (INDEPENDENT_AMBULATORY_CARE_PROVIDER_SITE_OTHER): Payer: PPO | Admitting: *Deleted

## 2017-01-24 DIAGNOSIS — Z5181 Encounter for therapeutic drug level monitoring: Secondary | ICD-10-CM

## 2017-01-24 DIAGNOSIS — I4891 Unspecified atrial fibrillation: Secondary | ICD-10-CM

## 2017-01-24 LAB — POCT INR: INR: 1.8

## 2017-02-02 ENCOUNTER — Ambulatory Visit: Payer: PPO | Admitting: Cardiology

## 2017-02-14 DIAGNOSIS — L03115 Cellulitis of right lower limb: Secondary | ICD-10-CM | POA: Diagnosis not present

## 2017-02-14 DIAGNOSIS — E785 Hyperlipidemia, unspecified: Secondary | ICD-10-CM | POA: Diagnosis not present

## 2017-02-14 DIAGNOSIS — D688 Other specified coagulation defects: Secondary | ICD-10-CM | POA: Diagnosis not present

## 2017-02-14 DIAGNOSIS — I480 Paroxysmal atrial fibrillation: Secondary | ICD-10-CM | POA: Diagnosis not present

## 2017-02-14 DIAGNOSIS — Z79899 Other long term (current) drug therapy: Secondary | ICD-10-CM | POA: Diagnosis not present

## 2017-02-14 DIAGNOSIS — R600 Localized edema: Secondary | ICD-10-CM | POA: Diagnosis not present

## 2017-02-14 DIAGNOSIS — L03116 Cellulitis of left lower limb: Secondary | ICD-10-CM | POA: Diagnosis not present

## 2017-02-14 DIAGNOSIS — I10 Essential (primary) hypertension: Secondary | ICD-10-CM | POA: Diagnosis not present

## 2017-02-14 DIAGNOSIS — F17220 Nicotine dependence, chewing tobacco, uncomplicated: Secondary | ICD-10-CM | POA: Diagnosis not present

## 2017-02-14 DIAGNOSIS — J449 Chronic obstructive pulmonary disease, unspecified: Secondary | ICD-10-CM | POA: Diagnosis not present

## 2017-02-14 DIAGNOSIS — I872 Venous insufficiency (chronic) (peripheral): Secondary | ICD-10-CM | POA: Diagnosis not present

## 2017-02-14 DIAGNOSIS — B9561 Methicillin susceptible Staphylococcus aureus infection as the cause of diseases classified elsewhere: Secondary | ICD-10-CM | POA: Diagnosis not present

## 2017-02-14 DIAGNOSIS — G4733 Obstructive sleep apnea (adult) (pediatric): Secondary | ICD-10-CM | POA: Diagnosis not present

## 2017-02-15 DIAGNOSIS — R600 Localized edema: Secondary | ICD-10-CM | POA: Diagnosis not present

## 2017-02-15 DIAGNOSIS — I10 Essential (primary) hypertension: Secondary | ICD-10-CM | POA: Diagnosis not present

## 2017-02-15 DIAGNOSIS — I480 Paroxysmal atrial fibrillation: Secondary | ICD-10-CM | POA: Diagnosis not present

## 2017-02-15 DIAGNOSIS — L03115 Cellulitis of right lower limb: Secondary | ICD-10-CM | POA: Diagnosis not present

## 2017-02-16 DIAGNOSIS — I10 Essential (primary) hypertension: Secondary | ICD-10-CM | POA: Diagnosis not present

## 2017-02-16 DIAGNOSIS — I480 Paroxysmal atrial fibrillation: Secondary | ICD-10-CM | POA: Diagnosis not present

## 2017-02-16 DIAGNOSIS — R600 Localized edema: Secondary | ICD-10-CM | POA: Diagnosis not present

## 2017-02-16 DIAGNOSIS — L03115 Cellulitis of right lower limb: Secondary | ICD-10-CM | POA: Diagnosis not present

## 2017-02-21 ENCOUNTER — Other Ambulatory Visit: Payer: Self-pay

## 2017-02-21 NOTE — Patient Outreach (Signed)
[facility] Care Management  02/21/2017  Tony HooverGeorge R [name] 1950/08/21 [id number]   Transition of care  Referral date: 02/21/17 Referral source: discharged from an inpatient [facility] on 02-16-17 Insurance: Health team advantage Attempt #1  Telephone call to patient regarding transition of care follow up. HIPAA compliant voice message left with call back phone number.  PLAN: RNCM will attempt 2nd telephone call to patient within 5 business days.   Afshin InaDavina Aadhya Bustamante RN,BSN,CCM [facility] Telephonic  [phone]

## 2017-02-23 ENCOUNTER — Other Ambulatory Visit: Payer: Self-pay

## 2017-02-23 NOTE — Patient Outreach (Signed)
[facility] Care Management  02/23/2017  Tony HooverGeorge R Beckstrand 1950/10/31 [id number]  Transition of care  Referral date: 02/23/17 Referral source: Discharged from in [facility] on 02/16/17 Insurance: Health team advantage  Telephone call to patient regarding transition of care follow up. HIPAA verified with patient.  Patient states he was recently in the hospital due to cellulitis. Patient states he has a follow up appointment with his new primary MD on 03/03/17 in [place]. Patient states he has recently moved to [place]. Patient states he continues on oral antibiotics as prescribed. Patient states he has all of his medications and is taking them as directed. Patient denies any new symptoms or concerns regarding his legs. Patient states his legs are clearing up from the cellulitis.  RNCM advised patient to follow up with his Health team advantage insurance carrier regarding his recent relocation to [place]. RNCM advised patient to contact his doctor for any change in his condition or new symptoms.  RNCM advised patient to call 911 for any emergent symptoms.    PLAN: RNCM will follow up with patient within 1 week.  RNCM will send patient [facility] care [facility] welcome packet/ consent RNCM unable to send involvement letter at this time due to patient having new upcoming  primary  MD appointment. Patient unsure of primary MD name.  [name] RN,BSN,CCM [facility] Telephonic  [phone]

## 2017-03-02 ENCOUNTER — Other Ambulatory Visit: Payer: Self-pay

## 2017-03-02 NOTE — Patient Outreach (Signed)
[facility] Care Management  03/02/2017  Henrietta HooverGeorge R [place] May 28, 1950 [id number]   Transition of care  Referral date: 02/21/17 Referral source: discharged [facility] on 02-16-17 Insurance: Health team advantage Attempt #1  Telephone call to patient regarding transition of care follow up. HIPAA compliant voice message left with call back phone number.  PLAN: RNCM will attempt 2nd telephone call to patient within 1 week  Amine InaDavina Eudora Guevarra RN,BSN,CCM [facility] Telephonic  [phone]

## 2017-03-03 DIAGNOSIS — R600 Localized edema: Secondary | ICD-10-CM | POA: Diagnosis not present

## 2017-03-03 DIAGNOSIS — I4891 Unspecified atrial fibrillation: Secondary | ICD-10-CM | POA: Diagnosis not present

## 2017-03-03 DIAGNOSIS — Z7901 Long term (current) use of anticoagulants: Secondary | ICD-10-CM | POA: Diagnosis not present

## 2017-03-08 ENCOUNTER — Other Ambulatory Visit: Payer: Self-pay

## 2017-03-08 NOTE — Patient Outreach (Signed)
[facility] Care Management  03/08/2017  Henrietta HooverGeorge R [place]umbull May 09, 1950 [id number]  Case closure:  Patient moved To [place] as stated in 02/23/17 encounter.    PLAN; RNCM will refer patient to care management assistant to close. Patient is no longer eligible for benefit ( patient has moved out of the service area). RNCM will send patient [facility] care [facility] closure letter. RNCM will send patients listed primary MD closure letter.  Keagon InaDavina Tiant Peixoto RN,BSN,CCM [facility] Telephonic  [phone]

## 2017-03-09 DIAGNOSIS — I10 Essential (primary) hypertension: Secondary | ICD-10-CM | POA: Diagnosis not present

## 2017-03-09 DIAGNOSIS — Z125 Encounter for screening for malignant neoplasm of prostate: Secondary | ICD-10-CM | POA: Diagnosis not present

## 2017-03-09 DIAGNOSIS — E786 Lipoprotein deficiency: Secondary | ICD-10-CM | POA: Diagnosis not present

## 2017-03-09 DIAGNOSIS — R0602 Shortness of breath: Secondary | ICD-10-CM | POA: Diagnosis not present

## 2017-03-17 DIAGNOSIS — Z7901 Long term (current) use of anticoagulants: Secondary | ICD-10-CM | POA: Diagnosis not present

## 2017-03-17 DIAGNOSIS — R21 Rash and other nonspecific skin eruption: Secondary | ICD-10-CM | POA: Diagnosis not present

## 2017-03-17 DIAGNOSIS — L299 Pruritus, unspecified: Secondary | ICD-10-CM | POA: Diagnosis not present

## 2017-04-01 ENCOUNTER — Telehealth: Payer: Self-pay | Admitting: Cardiology

## 2017-04-01 DIAGNOSIS — Z7901 Long term (current) use of anticoagulants: Secondary | ICD-10-CM | POA: Diagnosis not present

## 2017-04-01 MED ORDER — LOSARTAN POTASSIUM-HCTZ 100-12.5 MG PO TABS: 1.0000 | ORAL_TABLET | Freq: Every day | ORAL | 0 refills | Status: DC

## 2017-04-01 MED ORDER — DILTIAZEM HCL ER COATED BEADS 240 MG PO CP24: 240.0000 mg | ORAL_CAPSULE | Freq: Two times a day (BID) | ORAL | 0 refills | Status: DC

## 2017-04-01 MED ORDER — ATORVASTATIN CALCIUM 40 MG PO TABS: 40.0000 mg | ORAL_TABLET | Freq: Every day | ORAL | 0 refills | Status: DC

## 2017-04-01 NOTE — Telephone Encounter (Signed)
°*  STAT* If patient is at the pharmacy, call can be transferred to refill team.   1. Which medications need to be refilled? (please list name of each medication and dose if known)  atorvastatin (LIPITOR) 40 MG tablet [[id number]][[id number]]  diltiazem (CARTIA XT) 240 MG 24 hr capsule [[id number]][[id number]]  losartan-hydrochlorothiazide (HYZAAR) 100-12.5 MG tablet [[id number]][[id number]]    2. Which pharmacy/location (including street and city if local pharmacy) is medication to be sent to? [place]   3. Do they need a 30 day or 90 day supply?   Pt has moved out of town and will not get into see his new Dr until 05/12/17

## 2017-04-01 NOTE — Telephone Encounter (Signed)
Sent in refills for 30 days. Pt is past due for 1 year follow up.

## 2017-04-15 DIAGNOSIS — Z7901 Long term (current) use of anticoagulants: Secondary | ICD-10-CM | POA: Diagnosis not present

## 2017-05-05 ENCOUNTER — Telehealth: Payer: Self-pay | Admitting: *Deleted

## 2017-05-05 MED ORDER — ATORVASTATIN CALCIUM 40 MG PO TABS: 40.0000 mg | ORAL_TABLET | Freq: Every day | ORAL | 0 refills | Status: DC

## 2017-05-05 MED ORDER — DILTIAZEM HCL ER COATED BEADS 240 MG PO CP24: 240.0000 mg | ORAL_CAPSULE | Freq: Two times a day (BID) | ORAL | 0 refills | Status: DC

## 2017-05-05 MED ORDER — LOSARTAN POTASSIUM-HCTZ 100-12.5 MG PO TABS: 1.0000 | ORAL_TABLET | Freq: Every day | ORAL | 0 refills | Status: DC

## 2017-05-05 NOTE — Telephone Encounter (Signed)
Rx sent 

## 2017-06-07 ENCOUNTER — Other Ambulatory Visit: Payer: Self-pay | Admitting: *Deleted

## 2017-06-07 MED ORDER — WARFARIN SODIUM 10 MG PO TABS: ORAL_TABLET | ORAL | 1 refills | Status: AC

## 2017-06-07 MED ORDER — ATORVASTATIN CALCIUM 40 MG PO TABS: 40.0000 mg | ORAL_TABLET | Freq: Every day | ORAL | 1 refills | Status: DC

## 2017-06-07 MED ORDER — LOSARTAN POTASSIUM-HCTZ 100-12.5 MG PO TABS: 1.0000 | ORAL_TABLET | Freq: Every day | ORAL | 1 refills | Status: DC

## 2017-06-07 MED ORDER — DILTIAZEM HCL ER COATED BEADS 240 MG PO CP24: 240.0000 mg | ORAL_CAPSULE | Freq: Two times a day (BID) | ORAL | 1 refills | Status: DC

## 2017-07-11 ENCOUNTER — Other Ambulatory Visit: Payer: Self-pay | Admitting: Cardiology

## 2017-07-18 ENCOUNTER — Ambulatory Visit (INDEPENDENT_AMBULATORY_CARE_PROVIDER_SITE_OTHER): Payer: Medicare Other | Admitting: *Deleted

## 2017-07-18 DIAGNOSIS — I4891 Unspecified atrial fibrillation: Secondary | ICD-10-CM | POA: Diagnosis not present

## 2017-07-18 DIAGNOSIS — Z5181 Encounter for therapeutic drug level monitoring: Secondary | ICD-10-CM | POA: Diagnosis not present

## 2017-07-18 LAB — POCT INR: INR: 1.8

## 2017-09-04 ENCOUNTER — Other Ambulatory Visit: Payer: Self-pay | Admitting: Cardiology

## 2017-11-09 ENCOUNTER — Ambulatory Visit: Payer: Self-pay | Admitting: *Deleted

## 2018-06-22 ENCOUNTER — Ambulatory Visit: Payer: Medicare Other | Admitting: Student
